# Patient Record
Sex: Female | Born: 1985
Health system: Southern US, Community
[De-identification: ages and names within clinical notes are randomized; demographics above are authoritative.]

## PROBLEM LIST (undated history)

## (undated) DIAGNOSIS — IMO0002 Reserved for concepts with insufficient information to code with codable children: Secondary | ICD-10-CM

## (undated) DIAGNOSIS — F410 Panic disorder [episodic paroxysmal anxiety] without agoraphobia: Secondary | ICD-10-CM

## (undated) DIAGNOSIS — R569 Unspecified convulsions: Secondary | ICD-10-CM

## (undated) DIAGNOSIS — R011 Cardiac murmur, unspecified: Secondary | ICD-10-CM

## (undated) HISTORY — DX: Cardiac murmur, unspecified: R01.1

## (undated) HISTORY — DX: Reserved for concepts with insufficient information to code with codable children: IMO0002

## (undated) HISTORY — DX: Panic disorder (episodic paroxysmal anxiety): F41.0

## (undated) HISTORY — DX: Unspecified convulsions: R56.9

## (undated) HISTORY — PX: TONSILLECTOMY: SUR1361

---

## 2008-09-24 ENCOUNTER — Ambulatory Visit: Payer: Self-pay | Admitting: Internal Medicine

## 2008-09-24 ENCOUNTER — Inpatient Hospital Stay (HOSPITAL_COMMUNITY): Admission: EM | Admit: 2008-09-24 | Discharge: 2008-09-25 | Payer: Self-pay | Admitting: Emergency Medicine

## 2008-10-02 ENCOUNTER — Encounter (INDEPENDENT_AMBULATORY_CARE_PROVIDER_SITE_OTHER): Payer: Self-pay | Admitting: Internal Medicine

## 2008-10-27 ENCOUNTER — Encounter: Admission: RE | Admit: 2008-10-27 | Discharge: 2008-10-27 | Payer: Self-pay | Admitting: Orthopedic Surgery

## 2008-11-09 ENCOUNTER — Emergency Department (HOSPITAL_COMMUNITY): Admission: EM | Admit: 2008-11-09 | Discharge: 2008-11-09 | Payer: Self-pay | Admitting: Family Medicine

## 2009-01-09 ENCOUNTER — Emergency Department (HOSPITAL_COMMUNITY): Admission: EM | Admit: 2009-01-09 | Discharge: 2009-01-09 | Payer: Self-pay | Admitting: Emergency Medicine

## 2009-08-03 ENCOUNTER — Emergency Department (HOSPITAL_COMMUNITY): Admission: EM | Admit: 2009-08-03 | Discharge: 2009-08-03 | Payer: Self-pay | Admitting: Emergency Medicine

## 2009-08-05 ENCOUNTER — Emergency Department (HOSPITAL_COMMUNITY): Admission: EM | Admit: 2009-08-05 | Discharge: 2009-08-05 | Payer: Self-pay | Admitting: Emergency Medicine

## 2010-01-13 ENCOUNTER — Emergency Department (HOSPITAL_COMMUNITY): Admission: EM | Admit: 2010-01-13 | Discharge: 2010-01-13 | Payer: Self-pay | Admitting: Emergency Medicine

## 2011-01-14 LAB — DIFFERENTIAL
Basophils Absolute: 0 10*3/uL (ref 0.0–0.1)
Lymphocytes Relative: 7 % — ABNORMAL LOW (ref 12–46)
Lymphs Abs: 0.7 10*3/uL (ref 0.7–4.0)
Neutro Abs: 10 10*3/uL — ABNORMAL HIGH (ref 1.7–7.7)

## 2011-01-14 LAB — POCT I-STAT, CHEM 8
BUN: 12 mg/dL (ref 6–23)
Calcium, Ion: 0.94 mmol/L — ABNORMAL LOW (ref 1.12–1.32)
Chloride: 109 mEq/L (ref 96–112)
Creatinine, Ser: 0.5 mg/dL (ref 0.4–1.2)
Glucose, Bld: 84 mg/dL (ref 70–99)
HCT: 46 % (ref 36.0–46.0)
Hemoglobin: 15.6 g/dL — ABNORMAL HIGH (ref 12.0–15.0)
Potassium: 3.9 mEq/L (ref 3.5–5.1)
Sodium: 138 mEq/L (ref 135–145)
TCO2: 21 mmol/L (ref 0–100)

## 2011-01-14 LAB — POCT URINALYSIS DIP (DEVICE)
Bilirubin Urine: NEGATIVE
Ketones, ur: 15 mg/dL — AB
Specific Gravity, Urine: 1.025 (ref 1.005–1.030)

## 2011-01-14 LAB — CBC
Hemoglobin: 14.9 g/dL (ref 12.0–15.0)
Platelets: 323 10*3/uL (ref 150–400)
RDW: 13 % (ref 11.5–15.5)
WBC: 11 10*3/uL — ABNORMAL HIGH (ref 4.0–10.5)

## 2011-01-14 LAB — POCT PREGNANCY, URINE: Preg Test, Ur: NEGATIVE

## 2011-01-28 IMAGING — CT CT T SPINE W/O CM
3 of 4 series · 15 of 27 positions shown, 17 images · non-contrast
Comparison: Radiography 09/24/2008

CLINICAL DATA: Recent seizures.  Back pain.  Assess for fracture.

CT THORACIC SPINE WITHOUT CONTRAST
TECHNIQUE: Multidetector CT imaging of the thoracic spine was
performed without intravenous contrast administration. Multiplanar
CT image reconstructions were also generated

[Series 3: t spine · axial · 0.33mm/px · z∈[-208,-28]mm · 5 of 110 slices shown, 7 images]
[im 19/110  soft-tissue]
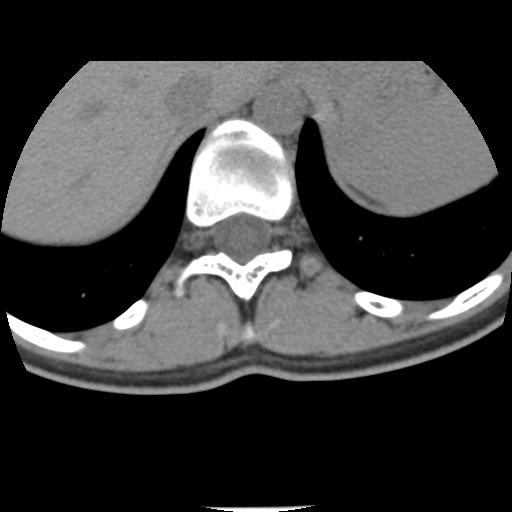
[im 19/110  bone]
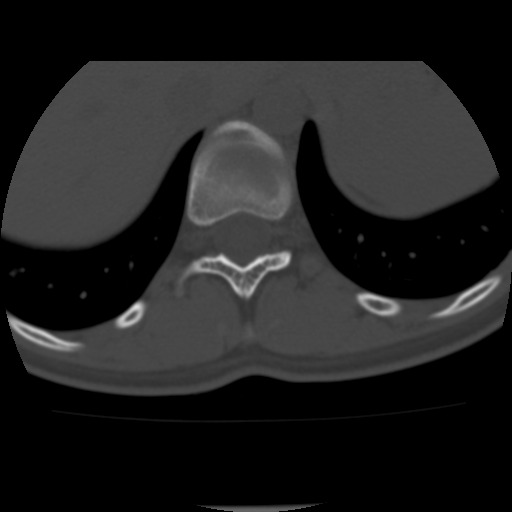
[im 37/110  bone]
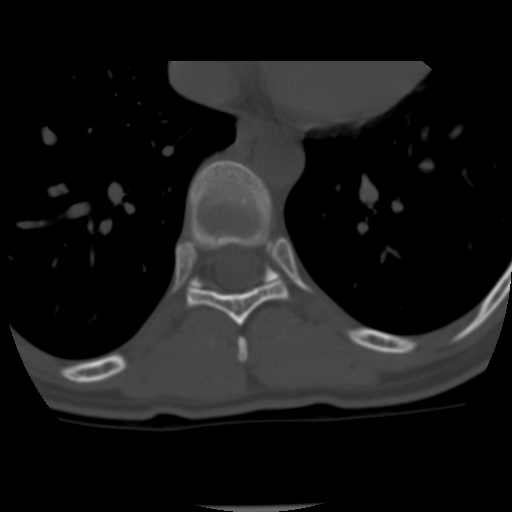
[im 55/110  bone]
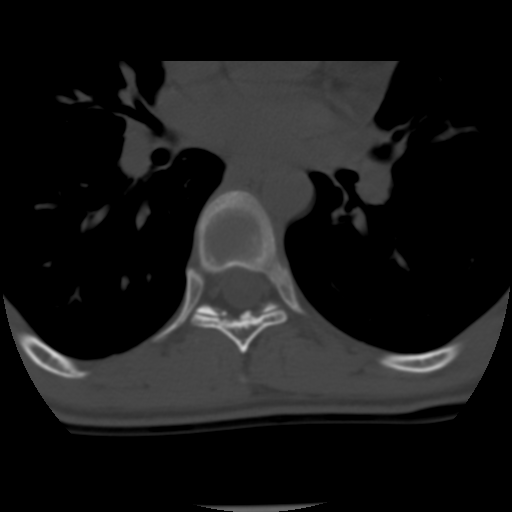
[im 73/110  bone]
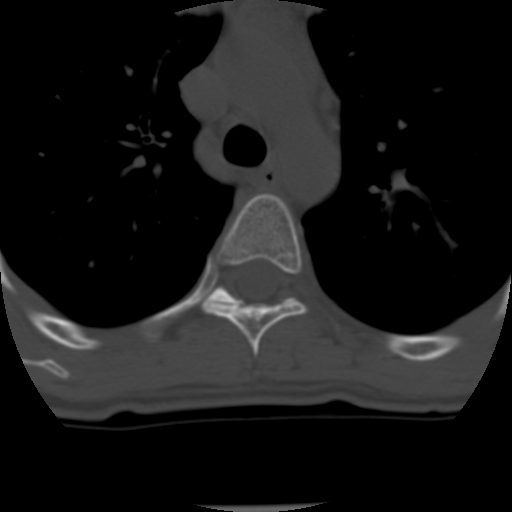
[im 91/110  soft-tissue]
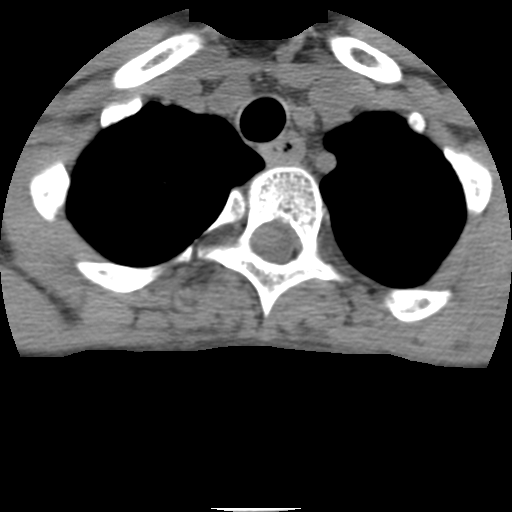
[im 91/110  bone]
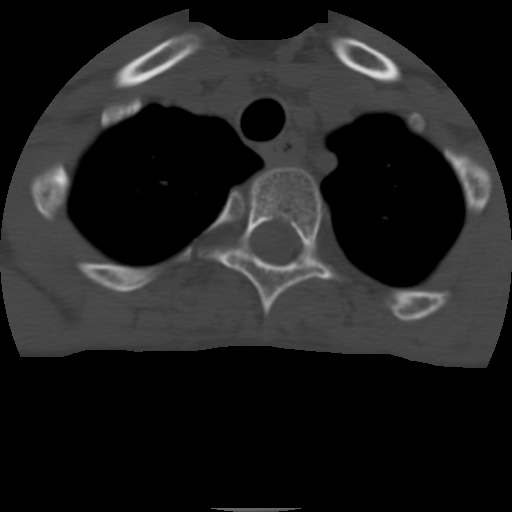

[Series 4: bone windows · axial · 0.33mm/px · z∈[-208,-28]mm · 5 of 110 slices shown]
[im 19/110  bone]
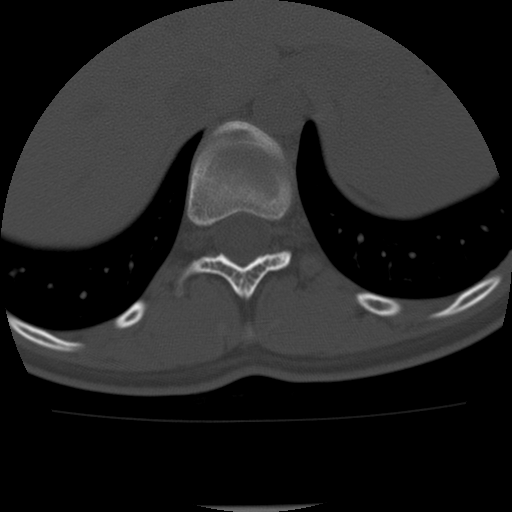
[im 37/110  bone]
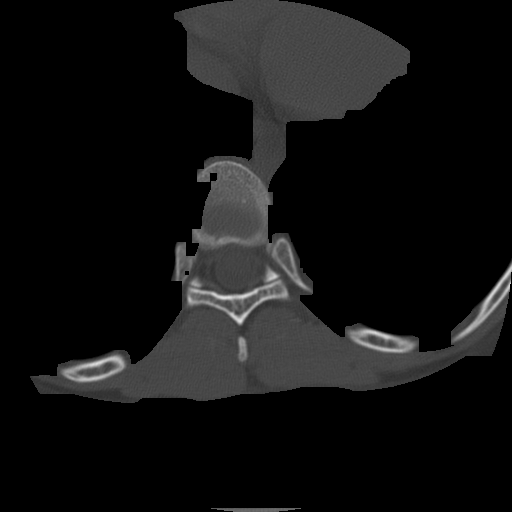
[im 55/110  bone]
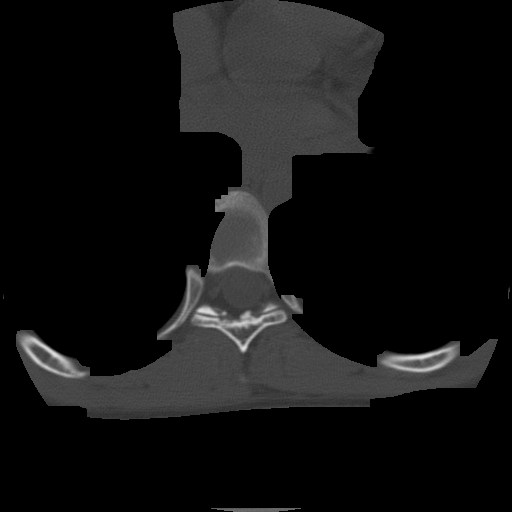
[im 73/110  bone]
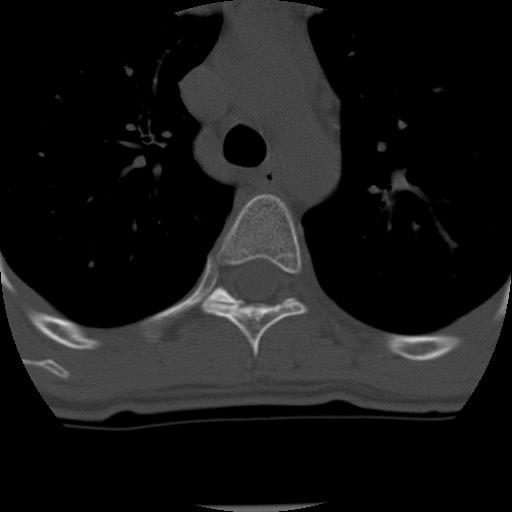
[im 91/110  bone]
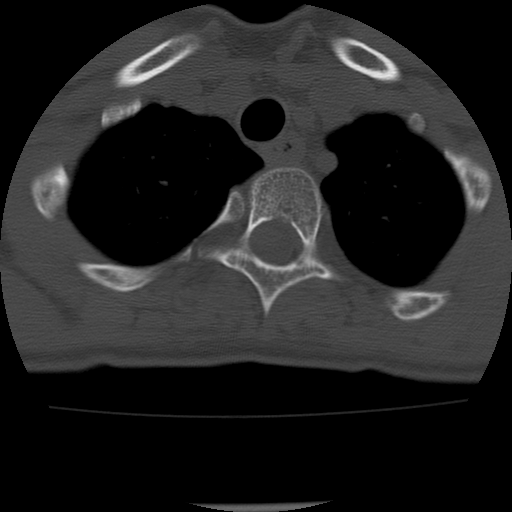

[Series 500: coronal · coronal · 0.54mm/px · 5 of 40 slices shown]
[im 7/40  bone]
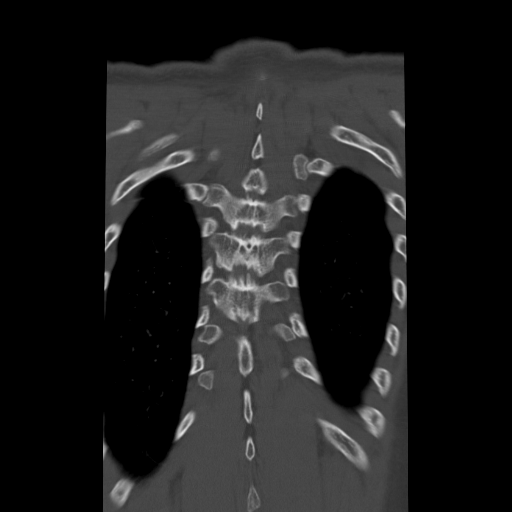
[im 14/40  bone]
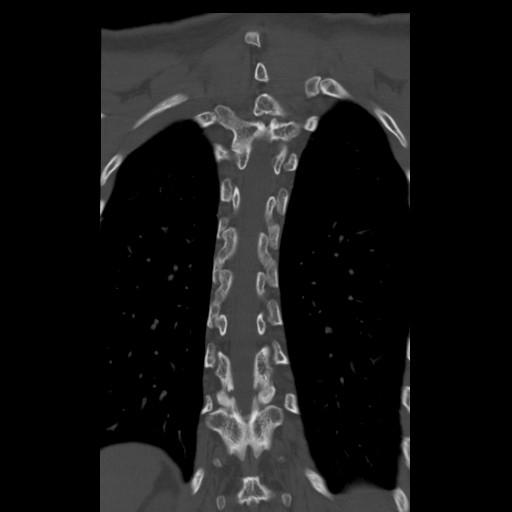
[im 20/40  bone]
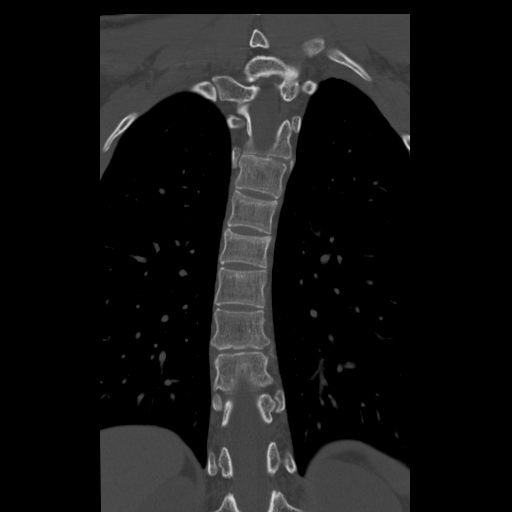
[im 27/40  bone]
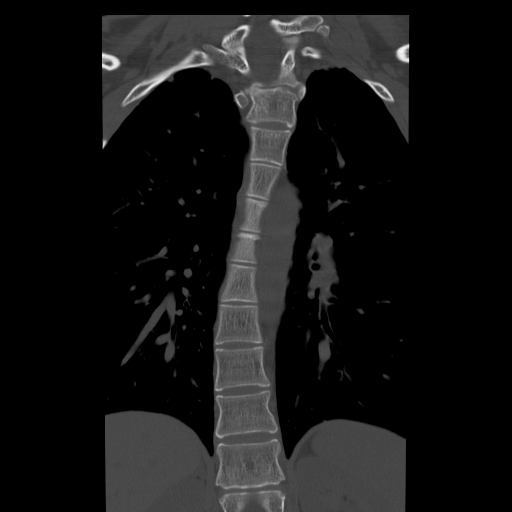
[im 33/40  bone]
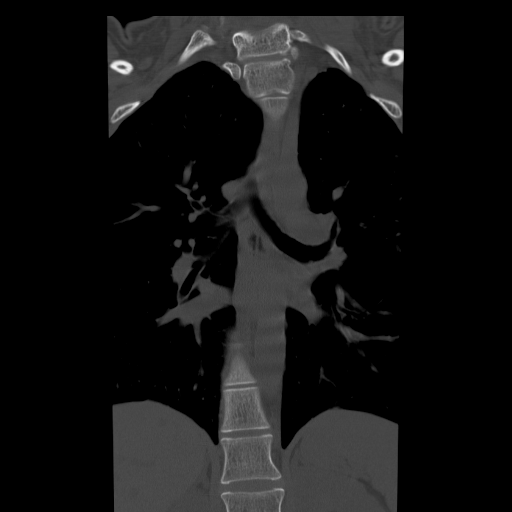

[15 of 27 positions shown; findings below may reference images not displayed]

FINDINGS: There is upper thoracic scoliosis convex to the left, and
mid thoracic scoliosis convex to the right.  There are minor
superior end plate fractures at T6 and T7 and to a minimal extent
at T8.  Loss of height is less than 10% at T7 and T8 and even less
than that at T9. No retropulsion.  No encroachment upon the canal
or foramina.
IMPRESSION: Scoliosis.

Minor superior end plate fractures at T6, T7 and T8.  Loss of
height of no more than 10% at T6 and T7 and even less than that at
T8.

## 2011-02-11 NOTE — H&P (Signed)
NAMEHUNTLEY, DEMEDEIROS NO.:  [id number]   MEDICAL RECORD NO.:  [id number]          PATIENT TYPE:  INP   LOCATION:  3005                         FACILITY:  [facility]   PHYSICIAN:  Gardiner Barefoot, MD    DATE OF BIRTH:  24-Feb-1986   DATE OF ADMISSION:  09/23/2008  DATE OF DISCHARGE:                              HISTORY & PHYSICAL   PRIMARY CARE PHYSICIAN:  Carolynn Sayers, NP   CHIEF COMPLAINT:  Fall.   HISTORY OF PRESENT ILLNESS:  This is a 25 year old female who recently  diagnosed with depressions and started on Wellbutrin who had a fall at  work where she blooded her nose.  The patient does not remember the  event, came out of the bathroom where she had at work and was  disoriented and brought into the emergency room.  During evaluation in  the emergency room, it was noticed that she had a seizure-like activity  by arms straining and then started to shake with tonic-clonic-like  activity with eyes rolling back.  This lasted for several minutes and  then has about 1-2 minutes postictal state and continued confusion for  several minutes after that.  No recent illnesses, no fever, and no  prodrome associated with this.   PAST MEDICAL HISTORY:  Depression.   MEDICATIONS:  1. Wellbutrin with dose recently increased.  2. Next birth control pills.   DRUG ALLERGIES:  No known drug allergies.   FAMILY HISTORY:  No history of seizures in any of family member.   SOCIAL HISTORY:  Occasional alcohol.  No cigarettes.   REVIEW OF SYSTEMS:  Negative except as per the history of present  illness.   PHYSICAL EXAMINATION:  VITAL SIGNS:  Temperature is 97.2, pulses of 100,  respirations 16, blood pressure 114/81, and O2 sats 97%.  GENERAL:  The patient is awake, alert, and oriented x3 in no acute  distress.  CARDIOVASCULAR:  Regular rate and rhythm.  No murmurs, rubs, or gallops.  LUNGS:  Clear to auscultation bilaterally.  ABDOMEN:  Soft, nontender, and nondistended.   Positive bowel sounds.  No  hepatosplenomegaly.  EXTREMITIES:  No cyanosis, clubbing, or edema.  NEUROLOGIC:  Nonfocal, equal strength bilaterally.  Reflexes intact.   Laboratory data includes a CT of the head and face with no acute  pathology or fractures.  CMP with no significant abnormalities.  CBC  with elevated white count 14.9 with 87% neutrophils.  Pregnancy  negative.   ASSESSMENT AND PLAN:  1. New seizure.  The event is temporally related with her Wellbutrin,      which has been started only last several weeks with a dose      increased in the last several days.  No significantly history of      any seizures and the CT scan does not suggest any sort of mass      lesion or bleed.  It was likely that she had a fall secondary to      seizure at her work and had a subsequent seizure here in the      emergency room.  Will certainly hold  her Wellbutrin and observe her      overnight with seizure precautions.  Also will check a urine drug      screen to assure there is no other drug aboard.  2. Prophylaxis.  The patient does not require deep vein thrombosis      prophylaxis due to her age.      Gardiner Barefoot, MD  Electronically Signed     RWC/MEDQ  D:  09/24/2008  T:  09/24/2008  Job:  [id number]   cc:   Carolynn Sayers, N.P.

## 2011-02-11 NOTE — Consult Note (Signed)
[name], [name] NO.:  [id number]   MEDICAL RECORD NO.:  [id number]          PATIENT TYPE:  INP   LOCATION:  3005                         FACILITY:  [facility]   PHYSICIAN:  Noel Christmas, MD    DATE OF BIRTH:  03-Jun-1986   DATE OF CONSULTATION:  09/24/2008  DATE OF DISCHARGE:                                 CONSULTATION   REFERRING PHYSICIAN:  [facility].   REASON FOR CONSULTATION:  New-onset seizure activity.   This is a 25 year old lady, who suffered an episode of loss of  consciousness while at work yesterday.  The patient was alone in  restroom at that time.  She woke up on the floor and did not remember  how she got there.  Workers described her as confused and incoherent.  The patient was bleeding from her nose, but was not aware of bleeding  until it was pointed out to her.  She was brought to the emergency room  for further evaluation.  The patient has second witnessed generalized  seizure while in the emergency room.  Her physical examination was  unremarkable except for mild redness of her forehead and superficial  abrasion on her chin.  Nose bleed had ceased prior to arriving in the  emergency room.  CT of her head showed no acute intracranial  abnormality.  WBC count was 14.9.  CBC was otherwise unremarkable.  Serum electrolytes were normal as was glucose, BUN, and creatinine.  Liver enzymes were also normal.  Urine drug screen was not obtained.  The patient was started on Wellbutrin about 3 weeks ago.  About 5 days  prior to admission, Wellbutrin was increased from 300 mg per day to 450  mg per day.  The patient's only other medication is birth control pills.   PAST MEDICAL HISTORY:  Unremarkable except for recurrent tonsillitis.  The patient is scheduled for elective tonsillectomy.   FAMILY HISTORY:  Noncontributory.   EXAMINATION:  Natalie [name] was young slender female, who was alert and  cooperative, in no acute distress.  She was  well-oriented to time as  well as place.  Short-term and long-term memory were normal except for  events yesterday surrounding the time of her periods of unconsciousness  and postictal confusion afterwards.  Pupils were equal and reactive  normally to light.  Extraocular movements were full and conjugate.  Visual fields were intact and normal.  There was no facial weakness.  Hearing was normal.  Speech and palatal movement were normal.  Coordination of extremities was normal.  Strength and muscle tone were  normal throughout.  Deep tendon reflexes were normal and symmetrical.  Plantar responses were flexor.  Sensory examination was normal.  Carotid  auscultation was normal.   CLINICAL IMPRESSION:  New-onset generalized seizure activity of unclear  etiology, but may well be secondary to treatment with Wellbutrin.  New-  onset primary seizure disorder, however, cannot be ruled out at this  point.   RECOMMENDATIONS:  1. MRI of her brain without and with contrast is planned.  2. EEG in the a.m. is planned.  3. Defer anticonvulsant medication unless  the patient has a recurrent      seizure (if so would treat with Keppra 1 g      intravenously as a loading dose followed by 500 mg q.12 h. either      IV or p.o.).  4. Urine drug screen.   Thank you for asking me to evaluate Natalie [name].      Noel Christmas, MD  Electronically Signed     CS/MEDQ  D:  09/24/2008  T:  09/25/2008  Job:  [id number]

## 2011-02-11 NOTE — Procedures (Signed)
EEG:  [id number].   CLINICAL HISTORY:  The patient is a 25 year old who was admitted for a  fall and a seizure.  She was at work.  She lost consciousness and had  seizure-like activity and hit the front of her face, nose, forehead,  bruised and blacked her eyes.  She has a history of depression, she is  on Wellbutrin.  Study is being done to look for presence of epilepsy  (780.39).   PROCEDURE:  Tracing was carried out on a 32-channel digital Cadwell  recorder reformatted into 16 channel montages with one devoted to EKG.  The patient was awake and briefly drowsy during the recording.  The  International 10/20 System Lead Placement was used.   Medications include diazepam, Zofran, Tylenol. Ativan.   DESCRIPTION OF FINDINGS:  Dominant frequency is a 10 Hz, 40-50 microvolt  well-modulated and regulated activity that attenuates partially with eye  opening.   Background activity is predominately alpha and beta range activity.  From time to time, generalized delta and lower theta range activity was  superimposed upon this.  There was a brief burst of sharply contoured  slow wave activity at T4, this did not recur.   Activating procedures with hyperventilation cause significant movement  artifact.  Photic stimulation induced a driving response at 7 and 9 Hz.  There was no interictal epileptiform activity in the form of spikes or  sharp waves.   EKG showed regular sinus rhythm with ventricular response at 72 beats  per minute.   IMPRESSION:  Normal waking record.      Deanna Artis. Sharene Skeans, M.D.  Electronically Signed     NWG:NFAO  D:  09/25/2008 11:19:49  T:  09/26/2008 00:03:03  Job #:  [id number]   cc:   Dr. Janee Morn

## 2011-02-11 NOTE — Discharge Summary (Signed)
Natalie [name], Natalie [name]            ACCOUNT NO.:  [id number]   MEDICAL RECORD NO.:  [id number]          PATIENT TYPE:  INP   LOCATION:  3005                         FACILITY:  [facility]   PHYSICIAN:  Michiel Cowboy, MDDATE OF BIRTH:  1986/06/11   DATE OF ADMISSION:  09/24/2008  DATE OF DISCHARGE:  09/25/2008                               DISCHARGE SUMMARY   PRIMARY CARE PHYSICIAN:  Nurse Practitioner, Carolynn Sayers at [facility] at [place].   CONSULTATIONS:  Noel Christmas, MD, Neurology.   DISCHARGE DIAGNOSES:  Seizure, likely felt to be secondary to Wellbutrin  and fall and head trauma.   HISTORY OF PRESENT ILLNESS:  Please see full H&P for all details.  Briefly, this is a 25 year old female with history of depression,  started on Wellbutrin and had increased the dose recently.  She fell at  work, does not remember how it happened.  Was disoriented.  Felt most  likely had a seizure.  In the emergency department, she had seizure-like  activity, tonic clonic-like activity with eyes rolling back.  She had  undergone MRI and EEG which was unremarkable.  Seen by Neurology.  Since  old studies were unremarkable, it was felt this was most likely  secondary to Wellbutrin.   STUDIES:  1. MRI of the brain, no significant intracranial abnormality except      for osteoma, left frontal sinus.  2. CT scan of the head showing no acute intracranial abnormality and      CT scan of her face with no evidence of facial bone injury.  There      is chronic sinus inflammation otherwise.  3. Chest x-ray on December 27 showed no acute chest process.  4. EEG done today per Neurology is unremarkable   DISCHARGE MEDICATIONS:  1. Hold Wellbutrin.  The patient is to be seen as soon as possible      with her primary care Nefertari Rebman, because she will otherwise need to      be started on an antidepressant medication which is different.      Will defer to her primary care physician to what they feel most     comfortable using, but the patient probably should avoid      Wellbutrin.  2. Birth control medications, Tri-Sprintec daily.   FOLLOWUP:  The patient is to follow up with her primary care doctor.      Michiel Cowboy, MD  Electronically Signed     AVD/MEDQ  D:  09/25/2008  T:  09/25/2008  Job:  [id number]   cc:   Deboraha Sprang at [facility] Carolynn Sayers, NP

## 2011-03-10 ENCOUNTER — Emergency Department (HOSPITAL_COMMUNITY)
Admission: EM | Admit: 2011-03-10 | Discharge: 2011-03-11 | Disposition: A | Discharge status: Other Acute Inpatient Hospital | Payer: 59 | Attending: Emergency Medicine | Admitting: Emergency Medicine

## 2011-03-10 DIAGNOSIS — F1910 Other psychoactive substance abuse, uncomplicated: Secondary | ICD-10-CM | POA: Insufficient documentation

## 2011-03-10 DIAGNOSIS — F411 Generalized anxiety disorder: Secondary | ICD-10-CM | POA: Insufficient documentation

## 2011-03-10 DIAGNOSIS — Z139 Encounter for screening, unspecified: Secondary | ICD-10-CM | POA: Insufficient documentation

## 2011-03-10 LAB — DIFFERENTIAL
Basophils Relative: 0 % (ref 0–1)
Eosinophils Absolute: 0.1 10*3/uL (ref 0.0–0.7)
Eosinophils Relative: 1 % (ref 0–5)
Lymphocytes Relative: 20 % (ref 12–46)
Monocytes Relative: 8 % (ref 3–12)
Neutrophils Relative %: 71 % (ref 43–77)

## 2011-03-10 LAB — RAPID URINE DRUG SCREEN, HOSP PERFORMED
Amphetamines: NOT DETECTED
Benzodiazepines: NOT DETECTED

## 2011-03-10 LAB — CBC
Hemoglobin: 13.9 g/dL (ref 12.0–15.0)
MCV: 90.4 fL (ref 78.0–100.0)
Platelets: 283 10*3/uL (ref 150–400)
RBC: 4.36 MIL/uL (ref 3.87–5.11)
WBC: 6.4 10*3/uL (ref 4.0–10.5)

## 2011-03-10 LAB — ETHANOL: Alcohol, Ethyl (B): <11 mg/dL — ABNORMAL HIGH (ref 0–10)

## 2011-03-10 LAB — BASIC METABOLIC PANEL
CO2: 24 mEq/L (ref 19–32)
Calcium: 9.7 mg/dL (ref 8.4–10.5)
Creatinine, Ser: 0.6 mg/dL (ref 0.4–1.2)
Glucose, Bld: 93 mg/dL (ref 70–99)

## 2011-03-11 ENCOUNTER — Inpatient Hospital Stay (HOSPITAL_COMMUNITY)
Admission: AD | Admit: 2011-03-11 | Discharge: 2011-03-14 | DRG: 885 | Disposition: A | Discharge status: Home / Self Care | Payer: 59 | Source: Ambulatory Visit | Attending: Psychiatry | Admitting: Psychiatry

## 2011-03-11 DIAGNOSIS — F1010 Alcohol abuse, uncomplicated: Secondary | ICD-10-CM

## 2011-03-11 DIAGNOSIS — F329 Major depressive disorder, single episode, unspecified: Secondary | ICD-10-CM

## 2011-03-11 DIAGNOSIS — M549 Dorsalgia, unspecified: Secondary | ICD-10-CM

## 2011-03-11 DIAGNOSIS — F1210 Cannabis abuse, uncomplicated: Secondary | ICD-10-CM

## 2011-03-11 DIAGNOSIS — H6690 Otitis media, unspecified, unspecified ear: Secondary | ICD-10-CM

## 2011-03-11 DIAGNOSIS — F339 Major depressive disorder, recurrent, unspecified: Secondary | ICD-10-CM

## 2011-03-11 DIAGNOSIS — Z560 Unemployment, unspecified: Secondary | ICD-10-CM

## 2011-03-11 DIAGNOSIS — F909 Attention-deficit hyperactivity disorder, unspecified type: Secondary | ICD-10-CM

## 2011-03-11 DIAGNOSIS — F1910 Other psychoactive substance abuse, uncomplicated: Secondary | ICD-10-CM

## 2011-03-11 DIAGNOSIS — F609 Personality disorder, unspecified: Secondary | ICD-10-CM

## 2011-03-11 DIAGNOSIS — Z6379 Other stressful life events affecting family and household: Secondary | ICD-10-CM

## 2011-03-11 LAB — PREGNANCY, URINE: Preg Test, Ur: NEGATIVE

## 2011-03-11 LAB — HEPATIC FUNCTION PANEL
ALT: 16 U/L (ref 0–35)
Alkaline Phosphatase: 87 U/L (ref 39–117)
Bilirubin, Direct: 0.1 mg/dL (ref 0.0–0.3)
Indirect Bilirubin: 0.3 mg/dL (ref 0.3–0.9)
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
## 2011-03-19 NOTE — Discharge Summary (Signed)
  NAMECARESSE, SEDIVY            ACCOUNT NO.:  [id number]  MEDICAL RECORD NO.:  [id number]  LOCATION:  0304                          [facility]ILITY[facility]  PHYSICIAN:  Franchot Gallo, MD     DATE OF BIRTH:  [birth year]  DATE OF ADMISSION:  03/11/2011 DATE OF DISCHARGE:  03/14/2011                              DISCHARGE SUMMARY   REASON FOR ADMISSION:  This is a 25 year old female who was admitted for Ritalin and Klonopin abuse, taking none in the past 2 weeks.  She was reporting her sleep was good.  Her appetite was good and having mild depressive symptoms but no suicidal or homicidal thoughts and having no substance withdrawal noticed.  FINAL IMPRESSION:  Major depressive disorder, recurrent without psychotic features, polysubstance abuse, cannabis alcohol stimulants and benzodiazepines.  PERTINENT LABS:  Urine drug screen is positive for marijuana.  SIGNIFICANT FINDINGS:  The patient was admitted to the adult unit on the substance group. We will assess need for medications.  The patient was attending groups, actively participating.  She admits to problems with her Ritalin and alcohol.  She denied that she overused her Klonopin, but wanting to get off all substances.  She denied any suicidal or was having no withdrawal symptoms and was interested in the IOP program. There was information obtained from the mother who felt that the patient had a prescription drug problem and that she was over medicating herself.  The patient was reporting not feeling so well after starting on Zoloft to feeling very shaky and twitchy, was having difficulty eating and reporting body aches and had trouble sleeping.  We talked to the pharmacist who felt that those symptoms would resolve quickly, so we continued with the Zoloft, but felt that the patient could benefit from a mood stabilizer as she was endorsing mood swings.  We talked to the patient who was agreeable to continuing both her Zoloft and  adding the Lamictal. On day of discharge the patient was feeling ready to go home. Her sleep was good, appetite was improving, having mild depressive symptoms rating it a 3 on a scale of 1-10, adamantly denying any suicidal or homicidal thoughts or delusional thinking.  Her anxiety was good, having no substance withdrawal.  The patient was discharged home.  DISCHARGE MEDICATIONS: 1. Zoloft 50 mg daily. 2. Lamictal 25 mg daily.  Her follow-up is with [facility] program on 03/17/2011 at 11:00 a.m., phone number [phone].     Landry Corporal, N.P.   ______________________________ Franchot Gallo, MD    JO/MEDQ  D:  03/18/2011  T:  03/18/2011  Job:  [id number]  Electronically Signed by Limmie PatriciaP. on 03/19/2011 09:32:20 AM Electronically Signed by Franchot Gallo MD on 03/19/2011 04:54:52 PM

## 2011-04-19 ENCOUNTER — Emergency Department (HOSPITAL_COMMUNITY): Payer: 59

## 2011-04-19 ENCOUNTER — Emergency Department (HOSPITAL_COMMUNITY)
Admission: EM | Admit: 2011-04-19 | Discharge: 2011-04-19 | Disposition: A | Discharge status: Home / Self Care | Payer: 59 | Attending: Emergency Medicine | Admitting: Emergency Medicine

## 2011-04-19 ENCOUNTER — Inpatient Hospital Stay (INDEPENDENT_AMBULATORY_CARE_PROVIDER_SITE_OTHER)
Admission: RE | Admit: 2011-04-19 | Discharge: 2011-04-19 | Disposition: A | Discharge status: Home / Self Care | Payer: 59 | Source: Ambulatory Visit | Attending: Emergency Medicine | Admitting: Emergency Medicine

## 2011-04-19 DIAGNOSIS — K8020 Calculus of gallbladder without cholecystitis without obstruction: Secondary | ICD-10-CM

## 2011-04-19 DIAGNOSIS — R109 Unspecified abdominal pain: Secondary | ICD-10-CM | POA: Insufficient documentation

## 2011-04-19 DIAGNOSIS — R1013 Epigastric pain: Secondary | ICD-10-CM | POA: Insufficient documentation

## 2011-04-19 DIAGNOSIS — R110 Nausea: Secondary | ICD-10-CM | POA: Insufficient documentation

## 2011-04-19 DIAGNOSIS — R10816 Epigastric abdominal tenderness: Secondary | ICD-10-CM

## 2011-04-19 LAB — DIFFERENTIAL
Basophils Absolute: 0 10*3/uL (ref 0.0–0.1)
Basophils Relative: 0 % (ref 0–1)
Eosinophils Relative: 1 % (ref 0–5)
Monocytes Absolute: 0.6 10*3/uL (ref 0.1–1.0)

## 2011-04-19 LAB — LIPASE, BLOOD: Lipase: 16 U/L (ref 11–59)

## 2011-04-19 LAB — POCT URINALYSIS DIP (DEVICE)
Leukocytes, UA: NEGATIVE
Nitrite: NEGATIVE
Protein, ur: NEGATIVE mg/dL
Urobilinogen, UA: 0.2 mg/dL (ref 0.0–1.0)
pH: 7 (ref 5.0–8.0)

## 2011-04-19 LAB — COMPREHENSIVE METABOLIC PANEL
ALT: 12 U/L (ref 0–35)
AST: 21 U/L (ref 0–37)
Alkaline Phosphatase: 74 U/L (ref 39–117)
CO2: 26 mEq/L (ref 19–32)
Calcium: 10 mg/dL (ref 8.4–10.5)
Chloride: 100 mEq/L (ref 96–112)
GFR calc non Af Amer: >60 mL/min (ref >60)
Potassium: 3.8 mEq/L (ref 3.5–5.1)
Sodium: 136 mEq/L (ref 135–145)

## 2011-04-19 LAB — AMYLASE: Amylase: 44 U/L (ref 0–105)

## 2011-04-19 LAB — CBC
MCHC: 35.8 g/dL (ref 30.0–36.0)
RDW: 12.4 % (ref 11.5–15.5)

## 2011-04-19 LAB — BASIC METABOLIC PANEL
Calcium: 9.3 mg/dL (ref 8.4–10.5)
Creatinine, Ser: 0.56 mg/dL (ref 0.50–1.10)
GFR calc non Af Amer: >60 mL/min (ref >60)
Glucose, Bld: 89 mg/dL (ref 70–99)
Sodium: 136 mEq/L (ref 135–145)

## 2011-04-21 ENCOUNTER — Emergency Department (HOSPITAL_COMMUNITY): Payer: 59

## 2011-04-21 ENCOUNTER — Telehealth: Payer: Self-pay | Admitting: Gastroenterology

## 2011-04-21 ENCOUNTER — Emergency Department (HOSPITAL_COMMUNITY)
Admission: EM | Admit: 2011-04-21 | Discharge: 2011-04-21 | Disposition: A | Discharge status: Home / Self Care | Payer: 59 | Attending: Emergency Medicine | Admitting: Emergency Medicine

## 2011-04-21 DIAGNOSIS — R1011 Right upper quadrant pain: Secondary | ICD-10-CM | POA: Insufficient documentation

## 2011-04-21 DIAGNOSIS — K2970 Gastritis, unspecified, without bleeding: Secondary | ICD-10-CM | POA: Insufficient documentation

## 2011-04-21 DIAGNOSIS — R1013 Epigastric pain: Secondary | ICD-10-CM | POA: Insufficient documentation

## 2011-04-21 DIAGNOSIS — R112 Nausea with vomiting, unspecified: Secondary | ICD-10-CM | POA: Insufficient documentation

## 2011-04-21 LAB — CBC
HCT: 36.3 % (ref 36.0–46.0)
MCHC: 35.3 g/dL (ref 30.0–36.0)
MCV: 89 fL (ref 78.0–100.0)
RDW: 12 % (ref 11.5–15.5)

## 2011-04-21 LAB — DIFFERENTIAL
Eosinophils Relative: 1 % (ref 0–5)
Lymphocytes Relative: 26 % (ref 12–46)
Lymphs Abs: 1.2 10*3/uL (ref 0.7–4.0)
Monocytes Absolute: 0.5 10*3/uL (ref 0.1–1.0)

## 2011-04-21 NOTE — Telephone Encounter (Signed)
Pt was called and she informed me that she spoke with her PCP and he advised her to go back to the ER.  She will call us back if she needs to f/u with GI.

## 2011-04-22 LAB — POCT I-STAT, CHEM 8
Chloride: 101 mEq/L (ref 96–112)
Creatinine, Ser: 0.8 mg/dL (ref 0.50–1.10)
Glucose, Bld: 90 mg/dL (ref 70–99)
Hemoglobin: 13.3 g/dL (ref 12.0–15.0)
Potassium: 3.8 mEq/L (ref 3.5–5.1)

## 2011-04-23 ENCOUNTER — Other Ambulatory Visit: Payer: Self-pay | Admitting: Gastroenterology

## 2011-04-25 ENCOUNTER — Ambulatory Visit
Admission: RE | Admit: 2011-04-25 | Discharge: 2011-04-25 | Disposition: A | Discharge status: Home / Self Care | Payer: 59 | Source: Ambulatory Visit | Attending: Gastroenterology | Admitting: Gastroenterology

## 2011-04-25 MED ORDER — IOHEXOL 300 MG/ML  SOLN
100.0000 mL | Freq: Once | INTRAMUSCULAR | Status: AC | PRN
  Administered 2011-04-25: 100 mL via INTRAVENOUS

## 2011-04-30 ENCOUNTER — Ambulatory Visit (HOSPITAL_COMMUNITY)
Admission: RE | Admit: 2011-04-30 | Discharge: 2011-04-30 | Disposition: A | Discharge status: Home / Self Care | Payer: 59 | Source: Ambulatory Visit | Attending: Gastroenterology | Admitting: Gastroenterology

## 2011-04-30 DIAGNOSIS — F3289 Other specified depressive episodes: Secondary | ICD-10-CM | POA: Insufficient documentation

## 2011-04-30 DIAGNOSIS — R109 Unspecified abdominal pain: Secondary | ICD-10-CM | POA: Insufficient documentation

## 2011-04-30 DIAGNOSIS — F329 Major depressive disorder, single episode, unspecified: Secondary | ICD-10-CM | POA: Insufficient documentation

## 2011-04-30 DIAGNOSIS — R112 Nausea with vomiting, unspecified: Secondary | ICD-10-CM | POA: Insufficient documentation

## 2011-07-04 LAB — COMPREHENSIVE METABOLIC PANEL
ALT: 12 U/L (ref 0–35)
Albumin: 4.4 g/dL (ref 3.5–5.2)
Alkaline Phosphatase: 53 U/L (ref 39–117)
BUN: 16 mg/dL (ref 6–23)
CO2: 25 mEq/L (ref 19–32)
Calcium: 9.1 mg/dL (ref 8.4–10.5)
Chloride: 104 mEq/L (ref 96–112)
Creatinine, Ser: 0.95 mg/dL (ref 0.4–1.2)
GFR calc non Af Amer: >60 mL/min (ref >60)
Glucose, Bld: 105 mg/dL — ABNORMAL HIGH (ref 70–99)
Sodium: 135 mEq/L (ref 135–145)
Total Bilirubin: 0.7 mg/dL (ref 0.3–1.2)
Total Bilirubin: 1 mg/dL (ref 0.3–1.2)
Total Protein: 7.6 g/dL (ref 6.0–8.3)

## 2011-07-04 LAB — URINE CULTURE
Colony Count: >=100000
Special Requests: NEGATIVE

## 2011-07-04 LAB — CBC
HCT: 35.1 % — ABNORMAL LOW (ref 36.0–46.0)
HCT: 40.4 % (ref 36.0–46.0)
Hemoglobin: 11.8 g/dL — ABNORMAL LOW (ref 12.0–15.0)
MCHC: 33.4 g/dL (ref 30.0–36.0)
MCHC: 34.2 g/dL (ref 30.0–36.0)
MCV: 89.6 fL (ref 78.0–100.0)
MCV: 89.8 fL (ref 78.0–100.0)
Platelets: 197 10*3/uL (ref 150–400)
Platelets: 275 10*3/uL (ref 150–400)
RDW: 13 % (ref 11.5–15.5)
RDW: 13.1 % (ref 11.5–15.5)
WBC: 4.4 10*3/uL (ref 4.0–10.5)

## 2011-07-04 LAB — URINALYSIS, ROUTINE W REFLEX MICROSCOPIC
Ketones, ur: NEGATIVE mg/dL
Protein, ur: NEGATIVE mg/dL
Urobilinogen, UA: 1 mg/dL (ref 0.0–1.0)

## 2011-07-04 LAB — DIFFERENTIAL
Basophils Absolute: 0 10*3/uL (ref 0.0–0.1)
Basophils Absolute: 0 10*3/uL (ref 0.0–0.1)
Basophils Relative: 0 % (ref 0–1)
Basophils Relative: 1 % (ref 0–1)
Eosinophils Absolute: 0 10*3/uL (ref 0.0–0.7)
Eosinophils Absolute: 0 10*3/uL (ref 0.0–0.7)
Lymphocytes Relative: 6 % — ABNORMAL LOW (ref 12–46)
Lymphs Abs: 1.5 10*3/uL (ref 0.7–4.0)
Monocytes Absolute: 1.1 10*3/uL — ABNORMAL HIGH (ref 0.1–1.0)
Monocytes Relative: 7 % (ref 3–12)
Neutro Abs: 12.9 10*3/uL — ABNORMAL HIGH (ref 1.7–7.7)
Neutro Abs: 2.4 10*3/uL (ref 1.7–7.7)
Neutrophils Relative %: 54 % (ref 43–77)
Neutrophils Relative %: 77 % (ref 43–77)

## 2011-07-04 LAB — RAPID URINE DRUG SCREEN, HOSP PERFORMED
Barbiturates: NOT DETECTED
Opiates: NOT DETECTED

## 2011-07-04 LAB — BASIC METABOLIC PANEL
BUN: 3 mg/dL — ABNORMAL LOW (ref 6–23)
CO2: 26 mEq/L (ref 19–32)
Calcium: 9 mg/dL (ref 8.4–10.5)
Creatinine, Ser: 0.62 mg/dL (ref 0.4–1.2)
GFR calc Af Amer: >60 mL/min (ref >60)

## 2011-09-16 ENCOUNTER — Emergency Department (HOSPITAL_COMMUNITY)
Admission: EM | Admit: 2011-09-16 | Discharge: 2011-09-17 | Disposition: A | Discharge status: Home / Self Care | Payer: 59 | Attending: Emergency Medicine | Admitting: Emergency Medicine

## 2011-09-16 ENCOUNTER — Encounter: Payer: Self-pay | Admitting: Emergency Medicine

## 2011-09-16 ENCOUNTER — Emergency Department (HOSPITAL_COMMUNITY): Payer: 59

## 2011-09-16 DIAGNOSIS — R05 Cough: Secondary | ICD-10-CM | POA: Insufficient documentation

## 2011-09-16 DIAGNOSIS — R059 Cough, unspecified: Secondary | ICD-10-CM | POA: Insufficient documentation

## 2011-09-16 DIAGNOSIS — J3489 Other specified disorders of nose and nasal sinuses: Secondary | ICD-10-CM | POA: Insufficient documentation

## 2011-09-16 DIAGNOSIS — IMO0001 Reserved for inherently not codable concepts without codable children: Secondary | ICD-10-CM | POA: Insufficient documentation

## 2011-09-16 DIAGNOSIS — J111 Influenza due to unidentified influenza virus with other respiratory manifestations: Secondary | ICD-10-CM | POA: Insufficient documentation

## 2011-09-16 DIAGNOSIS — R509 Fever, unspecified: Secondary | ICD-10-CM | POA: Insufficient documentation

## 2011-09-16 DIAGNOSIS — H9209 Otalgia, unspecified ear: Secondary | ICD-10-CM | POA: Insufficient documentation

## 2011-09-16 NOTE — ED Notes (Signed)
Pt here complaining of low grade fever and burning eyes and body aches and chills f

## 2011-09-16 NOTE — ED Notes (Signed)
PT. REPORTS PRODUCTIVE COUGH FOR 3 DAYS WITH RUNNY NOSE/ NASAL CONGESTION ,  RIGHT EAR ACHE .

## 2011-09-16 NOTE — ED Notes (Signed)
days.

## 2011-09-17 MED ORDER — GUAIFENESIN-CODEINE 100-10 MG/5ML PO SYRP: 5.0000 mL | ORAL_SOLUTION | Freq: Three times a day (TID) | ORAL | Status: AC | PRN

## 2011-09-17 MED ORDER — IBUPROFEN 200 MG PO TABS
400.0000 mg | ORAL_TABLET | Freq: Once | ORAL | Status: AC
  Administered 2011-09-17: 400 mg via ORAL
  Filled 2011-09-17: qty 2

## 2011-09-17 NOTE — ED Notes (Signed)
I gave the patient two warm blankets. 

## 2011-09-17 NOTE — ED Provider Notes (Signed)
History    25yF with cough. Feel achy all over. Rhinorrhea. Subjective fever. R ear pain. No drainage. No sore throat. Feels congested. No sick contacts. No sob. No unusal leg pain or swelling. Denies hx of blood clot. Does take exogenous estrogen. No n/v/d.   CSN: [id number] Arrival date & time: 09/16/2011  8:52 PM   First MD Initiated Contact with Patient 09/17/11 0002      Chief Complaint  Patient presents with  . Cough    (Consider location/radiation/quality/duration/timing/severity/associated sxs/prior treatment) HPI  History reviewed. No pertinent past medical history.  History reviewed. No pertinent past surgical history.  No family history on file.  History  Substance Use Topics  . Smoking status: Never Smoker   . Smokeless tobacco: Not on file  . Alcohol Use: No    OB History    Grav Para Term Preterm Abortions TAB SAB Ect Mult Living                  Review of Systems   Review of symptoms negative unless otherwise noted in HPI.   Allergies  Wellbutrin  Home Medications   Current Outpatient Rx  Name Route Sig Dispense Refill  . CLONAZEPAM 0.5 MG PO TABS Oral Take 0.5 mg by mouth 2 (two) times daily as needed. For anxiety     . NORGESTIMATE-ETH ESTRADIOL 0.25-35 MG-MCG PO TABS Oral Take 1 tablet by mouth daily.      . GUAIFENESIN-CODEINE 100-10 MG/5ML PO SYRP Oral Take 5 mLs by mouth 3 (three) times daily as needed for cough. 120 mL 0    BP 125/77  Pulse 101  Temp(Src) 100.7 F (38.2 C) (Oral)  Resp 18  SpO2 99%  LMP 09/02/2011  Physical Exam  Nursing note and vitals reviewed. Constitutional: She appears well-developed and well-nourished. No distress.  HENT:  Head: Normocephalic and atraumatic.  Right Ear: External ear normal.  Left Ear: External ear normal.  Mouth/Throat: Oropharynx is clear and moist. No oropharyngeal exudate.       TMs clear b/l. Boggy nasal mucosa  Eyes: Conjunctivae are normal. Pupils are equal, round, and reactive  to light. Right eye exhibits no discharge. Left eye exhibits no discharge.  Neck: Normal range of motion. Neck supple.  Cardiovascular: Normal rate, regular rhythm and normal heart sounds.  Exam reveals no gallop and no friction rub.   No murmur heard. Pulmonary/Chest: Effort normal and breath sounds normal. No respiratory distress.  Abdominal: Soft. She exhibits no distension. There is no tenderness.  Genitourinary:       No cva tenderness  Musculoskeletal: She exhibits no edema and no tenderness.  Lymphadenopathy:    She has no cervical adenopathy.  Neurological: She is alert.  Skin: Skin is warm and dry. No rash noted.  Psychiatric: She has a normal mood and affect. Her behavior is normal. Thought content normal.    ED Course  Procedures (including critical care time)  Labs Reviewed - No data to display Dg Chest 2 View  09/16/2011  *RADIOLOGY REPORT*  Clinical Data: Shortness of breath, cough  CHEST - 2 VIEW  Comparison: 09/24/2008  Findings: Lungs are clear. No pleural effusion or pneumothorax.  Cardiomediastinal silhouette is within normal limits.  Mild degenerative changes with curvature of the visualized thoracolumbar spine.  IMPRESSION: No evidence of acute cardiopulmonary disease.  Original Report Authenticated By: Charline Bills, M.D.     1. Fever   2. Influenza       MDM  25yf with fever,  general malaise and cough. Consider pneumonia, influenza, PE. Clinically suspect flu. Pt with no respiratory distress. Satting 100% on RA. Plan symptomatic tx and outpt fu. Strict return precautions discussed.       Raeford Razor, MD 09/24/11 1248

## 2011-10-16 ENCOUNTER — Emergency Department (HOSPITAL_COMMUNITY)
Admission: EM | Admit: 2011-10-16 | Discharge: 2011-10-16 | Disposition: A | Discharge status: Home / Self Care | Payer: 59 | Attending: Emergency Medicine | Admitting: Emergency Medicine

## 2011-10-16 DIAGNOSIS — K047 Periapical abscess without sinus: Secondary | ICD-10-CM | POA: Insufficient documentation

## 2011-10-16 DIAGNOSIS — K029 Dental caries, unspecified: Secondary | ICD-10-CM | POA: Insufficient documentation

## 2011-10-16 DIAGNOSIS — K0889 Other specified disorders of teeth and supporting structures: Secondary | ICD-10-CM

## 2011-10-16 MED ORDER — IBUPROFEN 200 MG PO TABS: 600.0000 mg | ORAL_TABLET | Freq: Once | ORAL | Status: DC

## 2011-10-16 MED ORDER — AMOXICILLIN 500 MG PO CAPS: 500.0000 mg | ORAL_CAPSULE | Freq: Three times a day (TID) | ORAL | Status: AC

## 2011-10-16 MED ORDER — HYDROCODONE-ACETAMINOPHEN 5-500 MG PO TABS: 1.0000 | ORAL_TABLET | Freq: Four times a day (QID) | ORAL | Status: AC | PRN

## 2011-10-16 NOTE — ED Notes (Signed)
Pt c/o of left sided tooth pain that is constant 9/10. Pt unable to sleep.

## 2011-10-16 NOTE — ED Provider Notes (Signed)
History     CSN: [id number]  Arrival date & time 10/16/11  1256   First MD Initiated Contact with Patient 10/16/11 1306     chief complaint:dental pain x 2 week. No chief complaint on file.   (Consider location/radiation/quality/duration/timing/severity/associated sxs/prior treatment) The history is provided by the patient.  pt c/o left upper dental pain for past 2 weeks, states thinks part of tooth broke off. Denies trauma/injury. Constant, dull pain worse w eating. No radiation of the pain. No facial redness/swelling. No fever or chills. No trouble breathing or swallowing.   No past medical history on file.  No past surgical history on file.  No family history on file.  History  Substance Use Topics  . Smoking status: Never Smoker   . Smokeless tobacco: Not on file  . Alcohol Use: No    OB History    Grav Para Term Preterm Abortions TAB SAB Ect Mult Living                  Review of Systems  Constitutional: Negative for fever and chills.  HENT: Negative for sore throat.   Respiratory: Negative for shortness of breath.   Skin: Negative for rash.    Allergies  Wellbutrin  Home Medications   Current Outpatient Rx  Name Route Sig Dispense Refill  . CLONAZEPAM 0.5 MG PO TABS Oral Take 0.5 mg by mouth 2 (two) times daily as needed. For anxiety     . NORGESTIMATE-ETH ESTRADIOL 0.25-35 MG-MCG PO TABS Oral Take 1 tablet by mouth daily.        BP 118/73  Pulse 69  Temp 98.2 F (36.8 C)  Resp 20  SpO2 100%  Physical Exam  Nursing note and vitals reviewed. Constitutional: She appears well-developed and well-nourished. No distress.  HENT:  Mouth/Throat: Oropharynx is clear and moist.       Left upper molar, decayed, broken posteriorly, assoc gum swelling/tenderness. No trismus. No facial swelling. Pharynx normal, no swelling.   Eyes: Conjunctivae are normal. No scleral icterus.  Neck: Neck supple. No tracheal deviation present.  Cardiovascular: Normal rate.     Pulmonary/Chest: Effort normal. No respiratory distress.  Abdominal: Normal appearance.  Neurological: She is alert.  Skin: Skin is warm and dry. No rash noted.  Psychiatric: She has a normal mood and affect.    ED Course  Procedures (including critical care time)     MDM  Confirmed only med allergy/intol is wellbutrin.  Discussed need for close dental follow up.   rx amox, vicodin.         Suzi Roots, MD 10/16/11 [phone]

## 2012-02-25 ENCOUNTER — Encounter: Payer: Self-pay | Admitting: *Deleted

## 2012-05-23 ENCOUNTER — Emergency Department (HOSPITAL_COMMUNITY): Payer: 59

## 2012-05-23 ENCOUNTER — Encounter (HOSPITAL_COMMUNITY): Payer: Self-pay | Admitting: Radiology

## 2012-05-23 ENCOUNTER — Emergency Department (HOSPITAL_COMMUNITY)
Admission: EM | Admit: 2012-05-23 | Discharge: 2012-05-24 | Disposition: A | Discharge status: Home / Self Care | Payer: 59 | Attending: Emergency Medicine | Admitting: Emergency Medicine

## 2012-05-23 DIAGNOSIS — R209 Unspecified disturbances of skin sensation: Secondary | ICD-10-CM | POA: Insufficient documentation

## 2012-05-23 DIAGNOSIS — M5030 Other cervical disc degeneration, unspecified cervical region: Secondary | ICD-10-CM | POA: Insufficient documentation

## 2012-05-23 DIAGNOSIS — R109 Unspecified abdominal pain: Secondary | ICD-10-CM | POA: Insufficient documentation

## 2012-05-23 DIAGNOSIS — IMO0002 Reserved for concepts with insufficient information to code with codable children: Secondary | ICD-10-CM | POA: Insufficient documentation

## 2012-05-23 DIAGNOSIS — R5381 Other malaise: Secondary | ICD-10-CM | POA: Insufficient documentation

## 2012-05-23 DIAGNOSIS — M542 Cervicalgia: Secondary | ICD-10-CM | POA: Insufficient documentation

## 2012-05-23 LAB — COMPREHENSIVE METABOLIC PANEL
ALT: 8 U/L (ref 0–35)
Alkaline Phosphatase: 69 U/L (ref 39–117)
CO2: 24 mEq/L (ref 19–32)
Chloride: 106 mEq/L (ref 96–112)
GFR calc Af Amer: >90 mL/min (ref >90)
GFR calc non Af Amer: >90 mL/min (ref >90)
Glucose, Bld: 88 mg/dL (ref 70–99)
Potassium: 3.7 mEq/L (ref 3.5–5.1)
Sodium: 139 mEq/L (ref 135–145)
Total Bilirubin: 0.2 mg/dL — ABNORMAL LOW (ref 0.3–1.2)
Total Protein: 7.1 g/dL (ref 6.0–8.3)

## 2012-05-23 LAB — CBC WITH DIFFERENTIAL/PLATELET
Hemoglobin: 10.9 g/dL — ABNORMAL LOW (ref 12.0–15.0)
Lymphocytes Relative: 12 % (ref 12–46)
Lymphs Abs: 1.2 10*3/uL (ref 0.7–4.0)
Neutrophils Relative %: 82 % — ABNORMAL HIGH (ref 43–77)
Platelets: 211 10*3/uL (ref 150–400)
RBC: 3.52 MIL/uL — ABNORMAL LOW (ref 3.87–5.11)
WBC: 10.1 10*3/uL (ref 4.0–10.5)

## 2012-05-23 LAB — ABO/RH: ABO/RH(D): O NEG

## 2012-05-23 LAB — URINALYSIS, ROUTINE W REFLEX MICROSCOPIC
Glucose, UA: NEGATIVE mg/dL
Hgb urine dipstick: NEGATIVE
Protein, ur: NEGATIVE mg/dL

## 2012-05-23 MED ORDER — SODIUM CHLORIDE 0.9 % IV SOLN
INTRAVENOUS | Status: DC
  Administered 2012-05-23: 19:00:00 via INTRAVENOUS
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
## 2012-05-23 NOTE — ED Notes (Signed)
Pt removed from LSB by Rancour, MD, pt remains in C collar

## 2012-05-23 NOTE — ED Notes (Signed)
Family member escorted back to patient's room. 

## 2012-05-23 NOTE — ED Notes (Signed)
PT. PLACED ON A CONTINOUS FETAL MONITOR / TOCOMETER , RAPID RESPONSE OB NURSE NOTIFIED.
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
## 2012-05-24 ENCOUNTER — Emergency Department (HOSPITAL_COMMUNITY): Payer: 59

## 2012-05-24 MED ORDER — OXYCODONE-ACETAMINOPHEN 5-325 MG PO TABS
1.0000 | ORAL_TABLET | Freq: Once | ORAL | Status: AC
  Administered 2012-05-24: 1 via ORAL
  Filled 2012-05-24: qty 1

## 2012-05-24 MED ORDER — RHO D IMMUNE GLOBULIN 1500 UNIT/2ML IJ SOLN
300.0000 ug | Freq: Once | INTRAMUSCULAR | Status: AC
  Administered 2012-05-24: 300 ug via INTRAMUSCULAR

## 2012-05-24 MED ORDER — HYDROCODONE-ACETAMINOPHEN 5-500 MG PO TABS: 1.0000 | ORAL_TABLET | Freq: Four times a day (QID) | ORAL | Status: AC | PRN

## 2012-05-24 NOTE — ED Notes (Signed)
Pt c/o left arm and leg tingling.  Pt states she is unable to point her left foot. Pt stated, "It feels like I'm moving it but it's not moving. And my arm feels the same.  I couldn't even feel them put the IV in".  Pt also c/o neck pain only with palpation - Rates pain 4/10

## 2012-05-24 NOTE — ED Notes (Signed)
Patient remains in MRI at this time.

## 2012-05-24 NOTE — ED Notes (Signed)
Patient transported to MRI 

## 2012-05-24 NOTE — ED Notes (Signed)
Pt returned from MRI °
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
## 2012-05-25 LAB — RH IG WORKUP (INCLUDES ABO/RH): Antibody Screen: NEGATIVE

## 2013-07-27 ENCOUNTER — Emergency Department (HOSPITAL_COMMUNITY)
Admission: EM | Admit: 2013-07-27 | Discharge: 2013-07-27 | Disposition: A | Discharge status: Home / Self Care | Payer: 59 | Attending: Emergency Medicine | Admitting: Emergency Medicine

## 2013-07-27 ENCOUNTER — Encounter (HOSPITAL_COMMUNITY): Payer: Self-pay | Admitting: Emergency Medicine

## 2013-07-27 DIAGNOSIS — R062 Wheezing: Secondary | ICD-10-CM | POA: Insufficient documentation

## 2013-07-27 DIAGNOSIS — R011 Cardiac murmur, unspecified: Secondary | ICD-10-CM | POA: Insufficient documentation

## 2013-07-27 DIAGNOSIS — F17200 Nicotine dependence, unspecified, uncomplicated: Secondary | ICD-10-CM | POA: Insufficient documentation

## 2013-07-27 DIAGNOSIS — Z87828 Personal history of other (healed) physical injury and trauma: Secondary | ICD-10-CM | POA: Insufficient documentation

## 2013-07-27 DIAGNOSIS — F410 Panic disorder [episodic paroxysmal anxiety] without agoraphobia: Secondary | ICD-10-CM | POA: Insufficient documentation

## 2013-07-27 DIAGNOSIS — J029 Acute pharyngitis, unspecified: Secondary | ICD-10-CM | POA: Insufficient documentation

## 2013-07-27 DIAGNOSIS — J3489 Other specified disorders of nose and nasal sinuses: Secondary | ICD-10-CM | POA: Insufficient documentation

## 2013-07-27 DIAGNOSIS — R05 Cough: Secondary | ICD-10-CM | POA: Insufficient documentation

## 2013-07-27 DIAGNOSIS — Z79899 Other long term (current) drug therapy: Secondary | ICD-10-CM | POA: Insufficient documentation

## 2013-07-27 DIAGNOSIS — R059 Cough, unspecified: Secondary | ICD-10-CM | POA: Insufficient documentation

## 2013-07-27 MED ORDER — HYDROCODONE-ACETAMINOPHEN 7.5-325 MG/15ML PO SOLN: 15.0000 mL | Freq: Four times a day (QID) | ORAL | Status: DC | PRN

## 2013-07-27 NOTE — ED Notes (Signed)
Present with 5 days of sore throat associated with loss of voice and pain with swallowing. Throat red.

## 2013-07-27 NOTE — ED Provider Notes (Signed)
CSN: [id number]     Arrival date & time 07/27/13  0010 History   First MD Initiated Contact with Patient 07/27/13 0118     Chief Complaint  Patient presents with  . Sore Throat   (Consider location/radiation/quality/duration/timing/severity/associated sxs/prior Treatment) HPI. History provided by pt.   Pt presents w/ severe sore throat for the past 5 days.  Associated w/ mild rhinorrhea and coughing.  Denies fever.   No relief w/ tylenol and ibuprofen.  Daughter has a cold currently.  H/o tonsillectomy. Past Medical History  Diagnosis Date  . Panic attacks   . Heart murmur   . Seizures   . Broken back     during seizure   Past Surgical History  Procedure Laterality Date  . Tonsillectomy     No family history on file. History  Substance Use Topics  . Smoking status: Current Some Day Smoker  . Smokeless tobacco: Not on file  . Alcohol Use: Yes     Comment: occasionally   OB History   Grav Para Term Preterm Abortions TAB SAB Ect Mult Living   1              Review of Systems  All other systems reviewed and are negative.    Allergies  Wellbutrin  Home Medications   Current Outpatient Rx  Name  Route  Sig  Dispense  Refill  . clonazePAM (KLONOPIN) 0.5 MG tablet   Oral   Take 0.5 mg by mouth 2 (two) times daily as needed for anxiety.         Marland Kitchen levonorgestrel-ethinyl estradiol (ENPRESSE,TRIVORA) tablet   Oral   Take 1 tablet by mouth daily.         . methylphenidate (RITALIN) 20 MG tablet   Oral   Take 20 mg by mouth 3 (three) times daily.          BP 120/90  Pulse 79  Temp(Src) 97.9 F (36.6 C) (Oral)  Resp 16  SpO2 100%  LMP 07/17/2013  Breastfeeding? Unknown Physical Exam  Nursing note and vitals reviewed. Constitutional: She is oriented to person, place, and time. She appears well-developed and well-nourished. No distress.  HENT:  Head: Normocephalic and atraumatic.  Erythema soft palate and posterior pharynx.  Tonsils absent. No exudate.   Uvula mid-line and no trismus.  Eyes:  Normal appearance  Neck: Normal range of motion.  Cardiovascular: Normal rate and regular rhythm.   Pulmonary/Chest: Effort normal. No respiratory distress.  Diffuse expiratory wheezing  Musculoskeletal: Normal range of motion.  Lymphadenopathy:    She has cervical adenopathy.  Neurological: She is alert and oriented to person, place, and time.  Skin: Skin is warm and dry. No rash noted.  Psychiatric: She has a normal mood and affect. Her behavior is normal.    ED Course  Procedures (including critical care time) Labs Review Labs Reviewed  RAPID STREP SCREEN   Imaging Review No results found.  EKG Interpretation   None       MDM  No diagnosis found. 27yo F, s/p tonsillectomy, presents w/ 5 days of sore throat.  Strep screen neg.  Pt d/c'd home w/ short course lortab elixir.  Recommended that she continue NSAID as well.  Return precautions discussed.  1:56 AM    Otilio Miu, PA-C 07/27/13 [phone]

## 2013-07-27 NOTE — ED Provider Notes (Signed)
Medical screening examination/treatment/procedure(s) were performed by non-physician practitioner and as supervising physician I was immediately available for consultation/collaboration.  Ayoub Arey M Kyan Yurkovich, MD 07/27/13 0746 

## 2013-07-28 ENCOUNTER — Emergency Department (HOSPITAL_COMMUNITY): Payer: Medicaid Other

## 2013-07-28 ENCOUNTER — Encounter (HOSPITAL_COMMUNITY): Payer: Self-pay | Admitting: Emergency Medicine

## 2013-07-28 ENCOUNTER — Emergency Department (HOSPITAL_COMMUNITY)
Admission: EM | Admit: 2013-07-28 | Discharge: 2013-07-28 | Disposition: A | Discharge status: Home / Self Care | Payer: Medicaid Other | Attending: Emergency Medicine | Admitting: Emergency Medicine

## 2013-07-28 DIAGNOSIS — Z8781 Personal history of (healed) traumatic fracture: Secondary | ICD-10-CM | POA: Insufficient documentation

## 2013-07-28 DIAGNOSIS — F410 Panic disorder [episodic paroxysmal anxiety] without agoraphobia: Secondary | ICD-10-CM | POA: Insufficient documentation

## 2013-07-28 DIAGNOSIS — F17200 Nicotine dependence, unspecified, uncomplicated: Secondary | ICD-10-CM | POA: Insufficient documentation

## 2013-07-28 DIAGNOSIS — R011 Cardiac murmur, unspecified: Secondary | ICD-10-CM | POA: Insufficient documentation

## 2013-07-28 DIAGNOSIS — G40909 Epilepsy, unspecified, not intractable, without status epilepticus: Secondary | ICD-10-CM | POA: Insufficient documentation

## 2013-07-28 DIAGNOSIS — J029 Acute pharyngitis, unspecified: Secondary | ICD-10-CM

## 2013-07-28 DIAGNOSIS — Z79899 Other long term (current) drug therapy: Secondary | ICD-10-CM | POA: Insufficient documentation

## 2013-07-28 LAB — CULTURE, GROUP A STREP

## 2013-07-28 MED ORDER — DEXAMETHASONE SODIUM PHOSPHATE 10 MG/ML IJ SOLN
10.0000 mg | Freq: Once | INTRAMUSCULAR | Status: AC
  Administered 2013-07-28: 10 mg via INTRAMUSCULAR
  Filled 2013-07-28: qty 1

## 2013-07-28 MED ORDER — IOHEXOL 300 MG/ML  SOLN
75.0000 mL | Freq: Once | INTRAMUSCULAR | Status: AC | PRN
  Administered 2013-07-28: 75 mL via INTRAVENOUS

## 2013-07-28 MED ORDER — OXYCODONE HCL 5 MG/5ML PO SOLN: 5.0000 mg | ORAL | Status: DC | PRN

## 2013-07-28 MED ORDER — HYDROCODONE-ACETAMINOPHEN 5-325 MG PO TABS
2.0000 | ORAL_TABLET | Freq: Once | ORAL | Status: AC
  Administered 2013-07-28: 2 via ORAL
  Filled 2013-07-28: qty 2

## 2013-07-28 NOTE — ED Provider Notes (Signed)
CSN: [id number]     Arrival date & time 07/28/13  1804 History  This chart was scribed for non-physician practitioner Dierdre Forth, PA, working with Flint Melter, MD by Ronal Fear, ED scribe. This patient was seen in room TR10C/TR10C and the patient's care was started at 8:12 PM.    Chief Complaint  Patient presents with  . Sore Throat    Patient is a 27 y.o. female presenting with pharyngitis. The history is provided by the patient. No language interpreter was used.  Sore Throat This is a new problem. The current episode started more than 1 week ago. The problem occurs rarely. The problem has been gradually worsening. Pertinent negatives include no chest pain, no abdominal pain, no headaches and no shortness of breath. The symptoms are aggravated by swallowing. Nothing relieves the symptoms.    HPI Comments: Natalie [name] is a 27 y.o. female who presents to the Emergency Department complaining of gradual onset gradually worsening sore throat for the past 7x days. But, her pain has grown worse over the past 2x days days with associated painful swallowing and talking. She was told that the sore throat was viral and given pain medication with some relief after being seen here on the 29th for the sore throat. She was given pain medication with slight relief but she has run out. Since then the pain has not resolved.  She states that she can't get out of bed, she says she feels like she has had a fever, but has not measured it.  She has no difficulty swallowing, nor does she feel like there is anything stuck. She feels like there is nothing in the back of her throat, she states that it feels swollen and tight. She denies runny nose or congested sinuses. Pt had her tonsils removed 7x years ago.  Pt does not appear to be in any acute distress with no other complaints.   Past Medical History  Diagnosis Date  . Panic attacks   . Heart murmur   . Seizures   . Broken back     during seizure    Past Surgical History  Procedure Laterality Date  . Tonsillectomy     No family history on file. History  Substance Use Topics  . Smoking status: Current Some Day Smoker  . Smokeless tobacco: Not on file  . Alcohol Use: Yes     Comment: occasionally   OB History   Grav Para Term Preterm Abortions TAB SAB Ect Mult Living   1              Review of Systems  Constitutional: Negative for fever, chills and fatigue.  HENT: Positive for sore throat. Negative for congestion, dental problem, drooling, ear pain, facial swelling, mouth sores, postnasal drip, rhinorrhea, trouble swallowing and voice change.   Eyes: Negative for pain.  Respiratory: Negative for cough, chest tightness and shortness of breath.   Cardiovascular: Negative for chest pain.  Gastrointestinal: Negative for nausea, vomiting and abdominal pain.  Musculoskeletal: Negative for neck pain and neck stiffness.  Skin: Negative for rash.  Neurological: Negative for facial asymmetry and headaches.  Hematological: Negative for adenopathy.  Psychiatric/Behavioral: The patient is not nervous/anxious.   All other systems reviewed and are negative.    Allergies  Wellbutrin  Home Medications   Current Outpatient Rx  Name  Route  Sig  Dispense  Refill  . clonazePAM (KLONOPIN) 0.5 MG tablet   Oral   Take 0.5 mg by mouth 2 (  two) times daily as needed for anxiety.         Marland Kitchen HYDROcodone-acetaminophen (HYCET) 7.5-325 mg/15 ml solution   Oral   Take 15 mLs by mouth 4 (four) times daily as needed for pain.   120 mL   0   . levonorgestrel-ethinyl estradiol (ENPRESSE,TRIVORA) tablet   Oral   Take 1 tablet by mouth daily.         . methylphenidate (RITALIN) 20 MG tablet   Oral   Take 20 mg by mouth 3 (three) times daily.         Marland Kitchen oxyCODONE (ROXICODONE) 5 MG/5ML solution   Oral   Take 5 mLs (5 mg total) by mouth every 4 (four) hours as needed for pain.   75 mL   0    BP 112/75  Pulse 71  Temp(Src) 98.4 F  (36.9 C) (Oral)  Resp 18  Ht 5' (1.524 m)  Wt 105 lb (47.628 kg)  BMI 20.51 kg/m2  SpO2 100%  LMP 07/17/2013 Physical Exam  Constitutional: She is oriented to person, place, and time. She appears well-developed and well-nourished. No distress.  HENT:  Head: Normocephalic and atraumatic.  Right Ear: Tympanic membrane, external ear and ear canal normal.  Left Ear: Tympanic membrane, external ear and ear canal normal.  Nose: No mucosal edema or rhinorrhea. No epistaxis. Right sinus exhibits no maxillary sinus tenderness and no frontal sinus tenderness. Left sinus exhibits no maxillary sinus tenderness and no frontal sinus tenderness.  Mouth/Throat: Uvula is midline and mucous membranes are normal. Mucous membranes are not pale and not cyanotic. No dental abscesses or uvula swelling. Posterior oropharyngeal erythema present. No oropharyngeal exudate, posterior oropharyngeal edema or tonsillar abscesses.  Surgically absent tonsils Oropharyngeal erythema without exudate or edema  Eyes: Conjunctivae are normal. Pupils are equal, round, and reactive to light.  Neck: Trachea normal, normal range of motion, full passive range of motion without pain and phonation normal.  Normal phonation - no muffled or hot potato voice Handling secretions without difficulty, no stridor, patent airway  Cardiovascular: Normal rate and intact distal pulses.   Pulmonary/Chest: Effort normal and breath sounds normal. No stridor.  Abdominal: Soft. Bowel sounds are normal. There is no tenderness.  Musculoskeletal: Normal range of motion.  Lymphadenopathy:       Head (right side): No submental, no submandibular, no tonsillar, no preauricular, no posterior auricular and no occipital adenopathy present.       Head (left side): No submental, no submandibular, no tonsillar, no preauricular, no posterior auricular and no occipital adenopathy present.    She has no cervical adenopathy.       Right cervical: No superficial  cervical, no deep cervical and no posterior cervical adenopathy present.      Left cervical: No superficial cervical, no deep cervical and no posterior cervical adenopathy present.       Right: No supraclavicular adenopathy present.       Left: No supraclavicular adenopathy present.  No cervical lymphadenopathy  Neurological: She is alert and oriented to person, place, and time.  Skin: Skin is dry. No rash noted. She is not diaphoretic.  Psychiatric: She has a normal mood and affect.    ED Course  Procedures (including critical care time)  8:18 PM- Pt advised of plan for treatment possible CT scan of neck, wit CBC and IV fluids and pain medication and pt agrees.   Labs Review Labs Reviewed - No data to display Imaging Review Ct Soft Tissue Neck  W Contrast  07/28/2013   CLINICAL DATA:  Progressive sore throat.  EXAM: CT NECK WITH CONTRAST  TECHNIQUE: Multidetector CT imaging of the neck was performed using the standard protocol following the bolus administration of intravenous contrast.  CONTRAST:  75mL OMNIPAQUE IOHEXOL 300 MG/ML  SOLN  COMPARISON:  05/23/2012  FINDINGS: No retropharyngeal fluid collection or gas. No mucosal lesion is evident. No adenopathy. Normal vascular enhancement. Visualized upper mediastinum and lung apices clear. Mucoperiosteal thickening in the left maxillary sinus. Regional bones unremarkable.  IMPRESSION: 1. Left maxillary sinus disease. 2. Negative for abscess or adenopathy.   Electronically Signed   By: Oley Balm M.D.   On: 07/28/2013 21:49    EKG Interpretation   None       MDM   1. Viral pharyngitis      Natalie [name] 24 hours after initial evaluation with worsening sore throat, statements that it feels as if it might be swelling and she's had voice changes. Low likelihood of epiglottitis will assess with CT. Physical exam with surgically absent tonsils and no exudate or lesion on the back of the throat. No evidence of herpangina.  Review  shows that on 07/27/2013 her rapid strep was negative and her throat culture has grown no bacteria.    10:35 PM Patient's throat CT negative for abscess or adenopathy. I personally reviewed the imaging tests through PACS system.  I reviewed available ER/hospitalization records through the EMR.  Will give Decadron IM here in the emergency department and discharged home with further pain medication. Pt afebrile without tonsillar exudate. Presents without cervical lymphadenopathy, muffled or hot potato voice but with dysphagia; diagnosis of viral pharyngitis. No abx indicated. DC w symptomatic tx for pain  Did discuss importance of water rehydration. Presentation non concerning for PTA or infxn spread to soft tissue. No trismus or uvula deviation. Specific return precautions discussed. Pt able to drink water in ED without difficulty with intact air way. Recommended PCP follow up.  It has been determined that no acute conditions requiring further emergency intervention are present at this time. The patient/guardian have been advised of the diagnosis and plan. We have discussed signs and symptoms that warrant return to the ED, such as changes or worsening in symptoms.   Vital signs are stable at discharge.   BP 112/75  Pulse 71  Temp(Src) 98.4 F (36.9 C) (Oral)  Resp 18  Ht 5' (1.524 m)  Wt 105 lb (47.628 kg)  BMI 20.51 kg/m2  SpO2 100%  LMP 07/17/2013  Patient/guardian has voiced understanding and agreed to follow-up with the PCP or specialist.       Dierdre Forth, PA-C 07/28/13 2239

## 2013-07-28 NOTE — ED Notes (Signed)
Pt was tx here on the 29 th for a sore throat.  Was told it was viral and given pain medication.  Pt states it is getting worse and it is increasingly difficult to talk.

## 2013-07-28 NOTE — ED Notes (Signed)
PA at bedside evaluating patient

## 2013-07-29 NOTE — ED Provider Notes (Signed)
Medical screening examination/treatment/procedure(s) were performed by non-physician practitioner and as supervising physician I was immediately available for consultation/collaboration.  Flint Melter, MD 07/29/13 [phone]

## 2013-12-30 ENCOUNTER — Emergency Department (HOSPITAL_COMMUNITY)
Admission: EM | Admit: 2013-12-30 | Discharge: 2013-12-30 | Disposition: A | Discharge status: Home / Self Care | Payer: Medicaid Other | Attending: Emergency Medicine | Admitting: Emergency Medicine

## 2013-12-30 ENCOUNTER — Encounter (HOSPITAL_COMMUNITY): Payer: Self-pay | Admitting: Emergency Medicine

## 2013-12-30 ENCOUNTER — Emergency Department (HOSPITAL_COMMUNITY): Payer: Medicaid Other

## 2013-12-30 DIAGNOSIS — R011 Cardiac murmur, unspecified: Secondary | ICD-10-CM | POA: Insufficient documentation

## 2013-12-30 DIAGNOSIS — M545 Low back pain, unspecified: Secondary | ICD-10-CM | POA: Insufficient documentation

## 2013-12-30 DIAGNOSIS — F17200 Nicotine dependence, unspecified, uncomplicated: Secondary | ICD-10-CM | POA: Insufficient documentation

## 2013-12-30 DIAGNOSIS — M549 Dorsalgia, unspecified: Secondary | ICD-10-CM

## 2013-12-30 DIAGNOSIS — Z87311 Personal history of (healed) other pathological fracture: Secondary | ICD-10-CM | POA: Insufficient documentation

## 2013-12-30 DIAGNOSIS — R569 Unspecified convulsions: Secondary | ICD-10-CM | POA: Insufficient documentation

## 2013-12-30 DIAGNOSIS — M62830 Muscle spasm of back: Secondary | ICD-10-CM

## 2013-12-30 DIAGNOSIS — Z888 Allergy status to other drugs, medicaments and biological substances status: Secondary | ICD-10-CM | POA: Insufficient documentation

## 2013-12-30 DIAGNOSIS — F410 Panic disorder [episodic paroxysmal anxiety] without agoraphobia: Secondary | ICD-10-CM | POA: Insufficient documentation

## 2013-12-30 DIAGNOSIS — Z79899 Other long term (current) drug therapy: Secondary | ICD-10-CM | POA: Insufficient documentation

## 2013-12-30 DIAGNOSIS — M5380 Other specified dorsopathies, site unspecified: Secondary | ICD-10-CM | POA: Insufficient documentation

## 2013-12-30 MED ORDER — METHOCARBAMOL 500 MG PO TABS
1000.0000 mg | ORAL_TABLET | Freq: Once | ORAL | Status: AC
  Administered 2013-12-30: 1000 mg via ORAL
  Filled 2013-12-30: qty 2

## 2013-12-30 MED ORDER — METHOCARBAMOL 500 MG PO TABS: 1000.0000 mg | ORAL_TABLET | Freq: Four times a day (QID) | ORAL | Status: DC

## 2013-12-30 MED ORDER — KETOROLAC TROMETHAMINE 60 MG/2ML IM SOLN
60.0000 mg | Freq: Once | INTRAMUSCULAR | Status: AC
  Administered 2013-12-30: 60 mg via INTRAMUSCULAR
  Filled 2013-12-30: qty 2

## 2013-12-30 MED ORDER — NAPROXEN 500 MG PO TABS: 500.0000 mg | ORAL_TABLET | Freq: Two times a day (BID) | ORAL | Status: DC

## 2013-12-30 MED ORDER — OXYCODONE-ACETAMINOPHEN 5-325 MG PO TABS
1.0000 | ORAL_TABLET | Freq: Once | ORAL | Status: AC
  Administered 2013-12-30: 1 via ORAL
  Filled 2013-12-30: qty 1

## 2013-12-30 NOTE — ED Notes (Signed)
Pt has hx of back injury 3 yrs ago. C/o  Mid to lower back pain x 2 weeks. No further injury.

## 2013-12-30 NOTE — ED Provider Notes (Signed)
CSN: [id number]     Arrival date & time 12/30/13  1428 History   First MD Initiated Contact with Patient 12/30/13 1501     Chief Complaint  Patient presents with  . Back Pain     (Consider location/radiation/quality/duration/timing/severity/associated sxs/prior Treatment) HPI Comments: Patient with history of T-spine fracture after seizure several years ago presents with complaint of lower back pain and mid back pain. Patient states that she has chronic lower back soreness that is intermittent. This pain has been present for approximately 2 weeks and is similar to past pain. It is worse with movement and picking up her daughter. Patient has been taking Tylenol in the mornings without relief. For the past several days patient has had some sharper pain that is unusual for her in her left middle back. She denies pain with deep breathing or cough. No shortness of breath or chest pain. No fevers. Pain is also worse with movement. She is concerned that she is having a problem associated with her previous fracture. The onset of this condition was acute. The course is constant. Aggravating factors: none. Alleviating factors: none. Patient denies red flag signs and symptoms of lower back pain.     The history is provided by the patient.    Past Medical History  Diagnosis Date  . Panic attacks   . Heart murmur   . Seizures   . Broken back     during seizure   Past Surgical History  Procedure Laterality Date  . Tonsillectomy     History reviewed. No pertinent family history. History  Substance Use Topics  . Smoking status: Current Some Day Smoker  . Smokeless tobacco: Not on file  . Alcohol Use: Yes     Comment: occasionally   OB History   Grav Para Term Preterm Abortions TAB SAB Ect Mult Living   1              Review of Systems  Constitutional: Negative for fever and unexpected weight change.  Gastrointestinal: Negative for constipation.       Negative for fecal incontinence.     Genitourinary: Negative for dysuria, hematuria, flank pain, vaginal bleeding, vaginal discharge and pelvic pain.       Negative for urinary incontinence or retention.  Musculoskeletal: Positive for back pain.  Neurological: Negative for weakness and numbness.       Denies saddle paresthesias.    Allergies  Wellbutrin  Home Medications   Current Outpatient Rx  Name  Route  Sig  Dispense  Refill  . acetaminophen (TYLENOL) 325 MG tablet   Oral   Take 650 mg by mouth every 6 (six) hours as needed for mild pain.         . clonazePAM (KLONOPIN) 0.5 MG tablet   Oral   Take 0.5 mg by mouth 2 (two) times daily as needed for anxiety.         Marland Kitchen levonorgestrel-ethinyl estradiol (ENPRESSE,TRIVORA) tablet   Oral   Take 1 tablet by mouth daily.         . methylphenidate (RITALIN) 20 MG tablet   Oral   Take 20 mg by mouth 3 (three) times daily.         . methocarbamol (ROBAXIN) 500 MG tablet   Oral   Take 2 tablets (1,000 mg total) by mouth 4 (four) times daily.   20 tablet   0   . naproxen (NAPROSYN) 500 MG tablet   Oral   Take 1 tablet (500  mg total) by mouth 2 (two) times daily.   20 tablet   0    BP 144/81  Pulse 80  Temp(Src) 97.4 F (36.3 C) (Oral)  Resp 20  Ht 5\' 1"  (1.549 m)  Wt 105 lb (47.628 kg)  BMI 19.85 kg/m2  SpO2 100%  Physical Exam  Nursing note and vitals reviewed. Constitutional: She appears well-developed and well-nourished.  HENT:  Head: Normocephalic and atraumatic.  Eyes: Conjunctivae are normal.  Neck: Normal range of motion. Neck supple.  Pulmonary/Chest: Effort normal.  Abdominal: Soft. There is no tenderness. There is no CVA tenderness.  Musculoskeletal: Normal range of motion.       Cervical back: She exhibits normal range of motion, no tenderness and no bony tenderness.       Thoracic back: She exhibits tenderness and spasm. She exhibits normal range of motion and no bony tenderness.       Lumbar back: She exhibits tenderness. She  exhibits normal range of motion and no bony tenderness.       Back:  No step-off noted with palpation of spine.   Neurological: She is alert. She has normal strength and normal reflexes. No sensory deficit.  5/5 strength in entire lower extremities bilaterally. No sensation deficit.   Skin: Skin is warm and dry. No rash noted.  Psychiatric: She has a normal mood and affect.    ED Course  Procedures (including critical care time) Labs Review Labs Reviewed - No data to display Imaging Review Dg Thoracic Spine 2 View  12/30/2013   CLINICAL DATA:  Back pain  EXAM: THORACIC SPINE - 2 VIEW  COMPARISON:  None.  FINDINGS: Three views of thoracic spine submitted. There is no acute fracture or subluxation. Dextroscoliosis of lower thoracic spine with Cobb angle measuring about 20 degrees.  IMPRESSION: No acute fracture or subluxation. Dextroscoliosis with Cobb angle measuring about 20 degrees.   Electronically Signed   By: Natasha Mead M.D.   On: 12/30/2013 16:46     EKG Interpretation None      4:08 PM Patient seen and examined. Medications ordered.   Vital signs reviewed and are as follows: Filed Vitals:   12/30/13 1429  BP: 144/81  Pulse: 80  Temp: 97.4 F (36.3 C)  Resp: 20    No red flag s/s of low back pain. Patient was counseled on back pain precautions and told to do activity as tolerated but do not lift, push, or pull heavy objects more than 10 pounds for the next week.  Patient counseled to use ice or heat on back for no longer than 15 minutes every hour.   Patient prescribed muscle relaxer and counseled on proper use of muscle relaxant medication.    Urged patient not to drink alcohol, drive, or perform any other activities that requires focus while taking either of these medications.  Patient urged to follow-up with PCP if pain does not improve with treatment and rest or if pain becomes recurrent. Urged to return with worsening severe pain, loss of bowel or bladder control,  trouble walking.   The patient verbalizes understanding and agrees with the plan.   MDM   Final diagnoses:  Back spasm  Back pain   Patient with back pain with palpable muscle spasm. No neurological deficits. Patient is ambulatory. No warning symptoms of back pain including: loss of bowel or bladder control, night sweats, waking from sleep with back pain, unexplained fevers or weight loss, h/o cancer, IVDU, recent trauma. No concern  for cauda equina, epidural abscess, or other serious cause of back pain. Do not suspect PE, PNA, or other pulmonary etiology. Conservative measures such as rest, ice/heat and pain medicine indicated with PCP follow-up if no improvement with conservative management.      Renne CriglerJoshua Anahla Bevis, PA-C 12/30/13 1714

## 2013-12-30 NOTE — Discharge Instructions (Signed)
Please read and follow all provided instructions.  Your diagnoses today include:  1. Back spasm   2. Back pain     Tests performed today include:  Vital signs - see below for your results today  Medications prescribed:   Robaxin (methocarbamol) - muscle relaxer medication  DO NOT drive or perform any activities that require you to be awake and alert because this medicine can make you drowsy.    Naproxen - anti-inflammatory pain medication  Do not exceed 500mg  naproxen every 12 hours, take with food  You have been prescribed an anti-inflammatory medication or NSAID. Take with food. Take smallest effective dose for the shortest duration needed for your pain. Stop taking if you experience stomach pain or vomiting.   Take any prescribed medications only as directed.  Home care instructions:   Follow any educational materials contained in this packet  Please rest, use ice or heat on your back for the next several days  Do not lift, push, pull anything more than 10 pounds for the next week  Follow-up instructions: Please follow-up with your primary care provider in the next 1 week for further evaluation of your symptoms. If you do not have a primary care doctor -- see below for referral information.   Return instructions:  SEEK IMMEDIATE MEDICAL ATTENTION IF YOU HAVE:  New numbness, tingling, weakness, or problem with the use of your arms or legs  Severe back pain not relieved with medications  Loss control of your bowels or bladder  Increasing pain in any areas of the body (such as chest or abdominal pain)  Shortness of breath, dizziness, or fainting.   Worsening nausea (feeling sick to your stomach), vomiting, fever, or sweats  Any other emergent concerns regarding your health   Additional Information:  Your vital signs today were: BP 144/81   Pulse 80   Temp(Src) 97.4 F (36.3 C) (Oral)   Resp 20   Ht 5\' 1"  (1.549 m)   Wt 105 lb (47.628 kg)   BMI 19.85 kg/m2    SpO2 100% If your blood pressure (BP) was elevated above 135/85 this visit, please have this repeated by your doctor within one month. --------------

## 2013-12-30 NOTE — ED Notes (Signed)
Pt now requesting imagining now. PA made aware. Orders to follow.

## 2013-12-30 NOTE — ED Notes (Signed)
Pt in c/o mid back pain, states she had an old injury to that area and thinks she re-injured it when picking up her daughter, pain x2 weeks with lifting or quick movement

## 2013-12-31 NOTE — ED Provider Notes (Signed)
Medical screening examination/treatment/procedure(s) were performed by non-physician practitioner and as supervising physician I was immediately available for consultation/collaboration.   EKG Interpretation None        Tamecia Mcdougald S Kaloni Bisaillon, MD 12/31/13 1245 

## 2014-07-31 ENCOUNTER — Encounter (HOSPITAL_COMMUNITY): Payer: Self-pay | Admitting: Emergency Medicine

## 2015-09-20 ENCOUNTER — Emergency Department (HOSPITAL_BASED_OUTPATIENT_CLINIC_OR_DEPARTMENT_OTHER): Payer: 59

## 2015-09-20 ENCOUNTER — Emergency Department (HOSPITAL_BASED_OUTPATIENT_CLINIC_OR_DEPARTMENT_OTHER)
Admission: EM | Admit: 2015-09-20 | Discharge: 2015-09-20 | Disposition: A | Discharge status: Home / Self Care | Payer: 59 | Attending: Emergency Medicine | Admitting: Emergency Medicine

## 2015-09-20 ENCOUNTER — Encounter (HOSPITAL_BASED_OUTPATIENT_CLINIC_OR_DEPARTMENT_OTHER): Payer: Self-pay

## 2015-09-20 DIAGNOSIS — L03031 Cellulitis of right toe: Secondary | ICD-10-CM | POA: Insufficient documentation

## 2015-09-20 DIAGNOSIS — L03011 Cellulitis of right finger: Secondary | ICD-10-CM

## 2015-09-20 DIAGNOSIS — Z8659 Personal history of other mental and behavioral disorders: Secondary | ICD-10-CM | POA: Insufficient documentation

## 2015-09-20 DIAGNOSIS — R011 Cardiac murmur, unspecified: Secondary | ICD-10-CM | POA: Insufficient documentation

## 2015-09-20 DIAGNOSIS — F17200 Nicotine dependence, unspecified, uncomplicated: Secondary | ICD-10-CM | POA: Insufficient documentation

## 2015-09-20 DIAGNOSIS — Z87828 Personal history of other (healed) physical injury and trauma: Secondary | ICD-10-CM | POA: Insufficient documentation

## 2015-09-20 MED ORDER — HYDROCODONE-ACETAMINOPHEN 5-325 MG PO TABS: 1.0000 | ORAL_TABLET | ORAL | Status: DC | PRN

## 2015-09-20 MED ORDER — CEPHALEXIN 250 MG PO CAPS
500.0000 mg | ORAL_CAPSULE | Freq: Once | ORAL | Status: AC
Start: 2015-09-20 — End: 2015-09-20
  Administered 2015-09-20: 500 mg via ORAL
  Filled 2015-09-20: qty 2

## 2015-09-20 MED ORDER — HYDROCODONE-ACETAMINOPHEN 5-325 MG PO TABS
1.0000 | ORAL_TABLET | Freq: Once | ORAL | Status: AC
Start: 2015-09-20 — End: 2015-09-20
  Administered 2015-09-20: 1 via ORAL
  Filled 2015-09-20: qty 1

## 2015-09-20 MED ORDER — LIDOCAINE HCL (PF) 1 % IJ SOLN
10.0000 mL | Freq: Once | INTRAMUSCULAR | Status: DC
  Filled 2015-09-20: qty 10

## 2015-09-20 MED ORDER — LIDOCAINE HCL (PF) 1 % IJ SOLN
30.0000 mL | Freq: Once | INTRAMUSCULAR | Status: DC
  Filled 2015-09-20: qty 30

## 2015-09-20 MED ORDER — CEPHALEXIN 500 MG PO CAPS: 500.0000 mg | ORAL_CAPSULE | Freq: Four times a day (QID) | ORAL | Status: DC

## 2015-09-20 MED ORDER — LIDOCAINE HCL (PF) 1 % IJ SOLN
INTRAMUSCULAR | Status: AC
  Administered 2015-09-20: 10 mL
  Filled 2015-09-20: qty 5

## 2015-09-20 NOTE — ED Provider Notes (Signed)
CSN: [id number]     Arrival date & time 09/20/15  1546 History   First MD Initiated Contact with Patient 09/20/15 1622     Chief Complaint  Patient presents with  . Toe Injury      HPI  Patient presents for evaluation of right great toe pain. She did not injure her toe. However 4 days ago at work she noticed that her toe was painful. He's been intermittently draining since that time it is red and swollen she presents here.  Past Medical History  Diagnosis Date  . Panic attacks   . Heart murmur   . Seizures (HCC)   . Broken back     during seizure   Past Surgical History  Procedure Laterality Date  . Tonsillectomy     No family history on file. Social History  Substance Use Topics  . Smoking status: Current Some Day Smoker  . Smokeless tobacco: None  . Alcohol Use: Yes     Comment: occasionally   OB History    Gravida Para Term Preterm AB TAB SAB Ectopic Multiple Living   1              Review of Systems  Constitutional: Negative for fever, chills, diaphoresis, appetite change and fatigue.  HENT: Negative for mouth sores, sore throat and trouble swallowing.   Eyes: Negative for visual disturbance.  Respiratory: Negative for cough, chest tightness, shortness of breath and wheezing.   Cardiovascular: Negative for chest pain.  Gastrointestinal: Negative for nausea, vomiting, abdominal pain, diarrhea and abdominal distention.  Endocrine: Negative for polydipsia, polyphagia and polyuria.  Genitourinary: Negative for dysuria, frequency and hematuria.  Musculoskeletal: Negative for gait problem.  Skin: Positive for color change. Negative for pallor and rash.       Redness pain and drainage from the right great toe at the nail laterally  Neurological: Negative for dizziness, syncope, light-headedness and headaches.  Hematological: Does not bruise/bleed easily.  Psychiatric/Behavioral: Negative for behavioral problems and confusion.      Allergies  Wellbutrin  Home  Medications   Prior to Admission medications   Medication Sig Start Date End Date Taking? Authorizing Provider  cephALEXin (KEFLEX) 500 MG capsule Take 1 capsule (500 mg total) by mouth 4 (four) times daily. 09/20/15   Rolland PorterMark Jya Hughston, MD  HYDROcodone-acetaminophen (NORCO/VICODIN) 5-325 MG tablet Take 1 tablet by mouth every 4 (four) hours as needed. 09/20/15   Rolland PorterMark Oluwatosin Bracy, MD   BP 129/81 mmHg  Pulse 72  Temp(Src) 97.6 F (36.4 C) (Oral)  Resp 18  Ht 5' (1.524 m)  Wt 110 lb (49.896 kg)  BMI 21.48 kg/m2  SpO2 100%  LMP 09/08/2015 Physical Exam  Constitutional: She is oriented to person, place, and time. She appears well-developed and well-nourished. No distress.  HENT:  Head: Normocephalic.  Eyes: Conjunctivae are normal. Pupils are equal, round, and reactive to light. No scleral icterus.  Neck: Normal range of motion. Neck supple. No thyromegaly present.  Cardiovascular: Normal rate and regular rhythm.  Exam reveals no gallop and no friction rub.   No murmur heard. Pulmonary/Chest: Effort normal and breath sounds normal. No respiratory distress. She has no wheezes. She has no rales.  Abdominal: Soft. Bowel sounds are normal. She exhibits no distension. There is no tenderness. There is no rebound.  Musculoskeletal: Normal range of motion.       Feet:  Neurological: She is alert and oriented to person, place, and time.  Skin: Skin is warm and dry. No rash  noted.  Psychiatric: She has a normal mood and affect. Her behavior is normal.    ED Course  Procedures (including critical care time) Labs Review Labs Reviewed - No data to display  Imaging Review Dg Toe Great Right  09/20/2015  CLINICAL DATA:  Right great toe injury 4 days ago EXAM: RIGHT GREAT TOE COMPARISON:  None FINDINGS: Three views of the right first toe submitted. No acute fracture or subluxation. No radiopaque foreign body. IMPRESSION: Negative. Electronically Signed   By: Natasha Mead M.D.   On: 09/20/2015 16:17   I  have personally reviewed and evaluated these images and lab results as part of my medical decision-making.   EKG Interpretation None      MDM   Final diagnoses:  Paronychia, right    INCISION AND DRAINAGE Performed by: Claudean Kinds Consent: Verbal consent obtained. Risks and benefits: risks, benefits and alternatives were discussed Type: abscess  Body area: right great toe  Anesthesia: local infiltration  Incision was made with a scalpel.  Local anesthetic: lidocaine %  epinephrine  Anesthetic total: 4 ml  Complexity: complex Blunt dissection to break up loculations  Drainage: purulent  Drainage amount: scant  Packing material: 1/4 in iodoform gauze  Patient tolerance: Patient tolerated the procedure well with no immediate complications.       Rolland Porter, MD 09/20/15 [id number]

## 2015-09-20 NOTE — ED Notes (Signed)
Injured right great toe Sunday

## 2015-09-20 NOTE — Discharge Instructions (Signed)
Paronychia  Soak your foot, and remove the gauze in 2 days.   Paronychia is an infection of the skin. It happens near a fingernail or toenail. It may cause pain and swelling around the nail. Usually, it is not serious and it clears up with treatment. HOME CARE  Soak the fingers or toes in warm water as told by your doctor. You may be told to do this for 20 minutes, 2-3 times a day.  Keep the area dry when you are not soaking it.  Take medicines only as told by your doctor.  If you were given an antibiotic medicine, finish all of it even if you start to feel better.  Keep the affected area clean.  Do not try to drain a fluid-filled bump yourself.  Wear rubber gloves when putting your hands in water.  Wear gloves if your hands might touch cleaners or chemicals.  Follow your doctor's instructions about:  Wound care.  Bandage (dressing) changes and removal. GET HELP IF:  Your symptoms get worse or do not improve.  You have a fever or chills.  You have redness spreading from the affected area.  You have more fluid, blood, or pus coming from the affected area.  Your finger or knuckle is swollen or is hard to move.   This information is not intended to replace advice given to you by your health care provider. Make sure you discuss any questions you have with your health care provider.   Document Released: 09/03/2009 Document Revised: 01/30/2015 Document Reviewed: 08/23/2014 Elsevier Interactive Patient Education [employer]2016 [employer] Inc[employer]

## 2016-04-23 ENCOUNTER — Emergency Department (HOSPITAL_BASED_OUTPATIENT_CLINIC_OR_DEPARTMENT_OTHER)
Admission: EM | Admit: 2016-04-23 | Discharge: 2016-04-23 | Disposition: A | Discharge status: Home / Self Care | Payer: 59 | Attending: Emergency Medicine | Admitting: Emergency Medicine

## 2016-04-23 ENCOUNTER — Encounter (HOSPITAL_BASED_OUTPATIENT_CLINIC_OR_DEPARTMENT_OTHER): Payer: Self-pay

## 2016-04-23 DIAGNOSIS — H6980 Other specified disorders of Eustachian tube, unspecified ear: Secondary | ICD-10-CM

## 2016-04-23 DIAGNOSIS — J029 Acute pharyngitis, unspecified: Secondary | ICD-10-CM | POA: Insufficient documentation

## 2016-04-23 DIAGNOSIS — F17200 Nicotine dependence, unspecified, uncomplicated: Secondary | ICD-10-CM | POA: Insufficient documentation

## 2016-04-23 DIAGNOSIS — H6990 Unspecified Eustachian tube disorder, unspecified ear: Secondary | ICD-10-CM | POA: Insufficient documentation

## 2016-04-23 LAB — RAPID STREP SCREEN (MED CTR MEBANE ONLY): STREPTOCOCCUS, GROUP A SCREEN (DIRECT): NEGATIVE

## 2016-04-23 MED ORDER — IBUPROFEN 800 MG PO TABS: 800.0000 mg | ORAL_TABLET | Freq: Three times a day (TID) | ORAL | 0 refills | Status: AC | PRN

## 2016-04-23 MED ORDER — PSEUDOEPHEDRINE HCL 60 MG PO TABS: 60.0000 mg | ORAL_TABLET | Freq: Four times a day (QID) | ORAL | 0 refills | Status: AC | PRN

## 2016-04-23 MED ORDER — FLUTICASONE PROPIONATE 50 MCG/ACT NA SUSP: 2.0000 | Freq: Every day | NASAL | 0 refills | Status: AC

## 2016-04-23 MED ORDER — KETOROLAC TROMETHAMINE 30 MG/ML IJ SOLN
30.0000 mg | Freq: Once | INTRAMUSCULAR | Status: AC
  Administered 2016-04-23: 30 mg via INTRAMUSCULAR
  Filled 2016-04-23: qty 1

## 2016-04-23 MED ORDER — DIPHENHYDRAMINE HCL 25 MG PO CAPS: 25.0000 mg | ORAL_CAPSULE | Freq: Four times a day (QID) | ORAL | 0 refills | Status: AC | PRN

## 2016-04-23 MED FILL — SUDOGEST 60 MG TABLET: 60 | 7 days supply | Qty: 30 | Fill #0

## 2016-04-23 NOTE — ED Triage Notes (Signed)
C/o fever, sore throat, body aches, decreased hearing-NAD-steady gait

## 2016-04-23 NOTE — Discharge Instructions (Signed)
Read the information below.  Use the prescribed medication as directed.  Please discuss all new medications with your pharmacist.  You may return to the Emergency Department at any time for worsening condition or any new symptoms that concern you.   If you develop high fevers, difficulty swallowing or breathing, or you are unable to tolerate fluids by mouth, return to the ER immediately for a recheck.    °

## 2016-04-23 NOTE — ED Provider Notes (Signed)
[facility]   CSN: [id number] Arrival date & time: 04/23/16  1118  First Provider Contact:  First MD Initiated Contact with Patient 04/23/16 1151        History   Chief Complaint Chief Complaint  Patient presents with  . Fever    HPI Natalie [name] is a 30 y.o. female.  HPI   Pt presents with three days of subjective fever, myalgias, sore throat, and now with bilateral decreased hearing and ringing in the right ear.  Has taken aspirin and motrin without improvement.  Took 4 total doses of aspirin Monday to Tuesday and then began motrin only. Denies cough, SOB, nasal symptoms.  Is s/p tonsillectomy approximately 10 years ago due to recurrent strep infections.    Past Medical History:  Diagnosis Date  . Broken back    during seizure  . Heart murmur   . Panic attacks   . Seizures (HCC)     There are no active problems to display for this patient.   Past Surgical History:  Procedure Laterality Date  . TONSILLECTOMY      OB History    Gravida Para Term Preterm AB Living   1             SAB TAB Ectopic Multiple Live Births                   Home Medications    Prior to Admission medications   Not on File    Family History No family history on file.  Social History Social History  Substance Use Topics  . Smoking status: Current Some Day Smoker  . Smokeless tobacco: Never Used  . Alcohol use Yes     Comment: occasionally     Allergies   Wellbutrin [bupropion hcl]   Review of Systems Review of Systems  Constitutional: Positive for fatigue and fever.  HENT: Positive for hearing loss and sore throat. Negative for ear discharge, ear pain, facial swelling, rhinorrhea, sinus pressure and trouble swallowing.   Respiratory: Negative for cough and shortness of breath.   Musculoskeletal: Positive for myalgias. Negative for neck pain and neck stiffness.  Skin: Negative for rash.  Allergic/Immunologic: Negative for  immunocompromised state.     Physical Exam Updated Vital Signs BP 117/81 (BP Location: Left Arm)   Pulse 88   Temp 97.8 F (36.6 C) (Oral)   Resp 18   Ht 5' (1.524 m)   Wt 49.9 kg   LMP 04/14/2016   SpO2 100%   BMI 21.48 kg/m   Physical Exam  Constitutional: She appears well-developed and well-nourished. No distress.  HENT:  Head: Normocephalic and atraumatic.  Right Ear: Tympanic membrane and ear canal normal.  Left Ear: Tympanic membrane and ear canal normal.  Mouth/Throat: Mucous membranes are not dry. No trismus in the jaw. No uvula swelling. Posterior oropharyngeal erythema present. No oropharyngeal exudate or posterior oropharyngeal edema.  Tonsils absent.  Bilateral TMs clear, no erythema.  ?fluid   Eyes: Conjunctivae are normal.  Neck: Neck supple.  Cardiovascular: Normal rate and regular rhythm.   Pulmonary/Chest: Effort normal and breath sounds normal. No respiratory distress. She has no wheezes. She has no rales.  Neurological: She is alert.  Skin: She is not diaphoretic.  Nursing note and vitals reviewed.    ED Treatments / Results  Labs (all labs ordered are listed, but only abnormal results are displayed) Labs Reviewed  RAPID STREP SCREEN (NOT AT [facility])  CULTURE, GROUP A  STREP [facility])    EKG  EKG Interpretation None       Radiology No results found.  Procedures Procedures (including critical care time)  Medications Ordered in ED Medications - No data to display   Initial Impression / Assessment and Plan / ED Course  I have reviewed the triage vital signs and the nursing notes.  Pertinent labs & imaging results that were available during my care of the patient were reviewed by me and considered in my medical decision making (see chart for details).  Clinical Course    Afebrile, nontoxic patient with constellation of symptoms suggestive of viral syndrome.  No concerning findings on exam.  Strep screen negative.   Pt c/o significant  myalgias.  Would be too early to test for mono - pt made aware of this possibility if the symptoms continue.  No lymphadenopathy or abdominal tenderness or noted organomegaly.  Discharged home with supportive care, PCP follow up.  Discussed result, findings, treatment, and follow up  with patient.  Pt given return precautions.  Pt verbalizes understanding and agrees with plan.      Final Clinical Impressions(s) / ED Diagnoses   Final diagnoses:  Pharyngitis  Eustachian tube dysfunction, unspecified laterality    New Prescriptions Discharge Medication List as of 04/23/2016 12:42 PM    START taking these medications   Details  diphenhydrAMINE (BENADRYL) 25 mg capsule Take 1 capsule (25 mg total) by mouth every 6 (six) hours as needed (eustachian tube dysfunction)., Starting Wed 04/23/2016, Print    fluticasone (FLONASE) 50 MCG/ACT nasal spray Place 2 sprays into both nostrils daily., Starting Wed 04/23/2016, Print    ibuprofen (ADVIL,MOTRIN) 800 MG tablet Take 1 tablet (800 mg total) by mouth every 8 (eight) hours as needed., Starting Wed 04/23/2016, Print    pseudoephedrine (SUDAFED) 60 MG tablet Take 1 tablet (60 mg total) by mouth every 6 (six) hours as needed for congestion., Starting Wed 04/23/2016, Print         [place], [place] 04/23/16 1323    Jacalyn Lefevre, MD 04/25/16 1556

## 2016-04-25 LAB — CULTURE, GROUP A STREP (THRC)

## 2016-08-06 ENCOUNTER — Encounter (HOSPITAL_BASED_OUTPATIENT_CLINIC_OR_DEPARTMENT_OTHER): Payer: Self-pay | Admitting: Emergency Medicine

## 2016-08-06 ENCOUNTER — Emergency Department (HOSPITAL_BASED_OUTPATIENT_CLINIC_OR_DEPARTMENT_OTHER)
Admission: EM | Admit: 2016-08-06 | Discharge: 2016-08-06 | Disposition: A | Discharge status: Home / Self Care | Payer: 59 | Attending: Emergency Medicine | Admitting: Emergency Medicine

## 2016-08-06 DIAGNOSIS — F17200 Nicotine dependence, unspecified, uncomplicated: Secondary | ICD-10-CM | POA: Insufficient documentation

## 2016-08-06 DIAGNOSIS — H60503 Unspecified acute noninfective otitis externa, bilateral: Secondary | ICD-10-CM | POA: Insufficient documentation

## 2016-08-06 MED ORDER — NEOMYCIN-POLYMYXIN-HC 3.5-10000-1 OT SOLN: 4.0000 [drp] | Freq: Four times a day (QID) | OTIC | 0 refills | Status: AC

## 2016-08-06 MED ORDER — ACETAMINOPHEN 325 MG PO TABS
650.0000 mg | ORAL_TABLET | Freq: Once | ORAL | Status: AC
  Administered 2016-08-06: 650 mg via ORAL
  Filled 2016-08-06: qty 2

## 2016-08-06 NOTE — ED Notes (Signed)
ED Provider at bedside. 

## 2016-08-06 NOTE — ED Triage Notes (Signed)
Patient reports that she is having bilateral ear sensitivity and having pink conjunctiva ( noted in triage) starting about 4 days ago

## 2016-08-06 NOTE — Discharge Instructions (Signed)
You were seen in the ED for ear pain and red eyes. Your ear pain is likely from a bacteria, we have prescribed you ear drops. Please use as prescribed. Your red eyes will likely improve over the next week. Please see your PCP if your symptoms worsen. Take care!

## 2016-08-06 NOTE — ED Provider Notes (Signed)
[facility]   CSN: [id number] Arrival date & time: 08/06/16  1758   History   Chief Complaint Chief Complaint  Patient presents with  . Otalgia   HPI   Natalie [name] is a 30 y.o. female who presents with a 2 day history of bilateral ear pain as well as some eye redness. Patient states that it is painful to touch the outside of her ears. She feels like the pain starts from inside the ears. She denies any fevers, sore throat, runny nose, cough. She states that she has not put any foreign objects in her ears. Denies any recent swimming in lakes, pools, or  Oceans. Admits that she has had some decreased hearing in her left ear since the onset of the pain. No sick contacts. She notes that she has had ear infections before. Denies any ear or eye discharge.   Past Medical History:  Diagnosis Date  . Broken back    during seizure  . Heart murmur   . Panic attacks   . Seizures (HCC)     There are no active problems to display for this patient.   Past Surgical History:  Procedure Laterality Date  . TONSILLECTOMY      OB History    Gravida Para Term Preterm AB Living   1             SAB TAB Ectopic Multiple Live Births                   Home Medications    Prior to Admission medications   Medication Sig Start Date End Date Taking? Authorizing Provider  diphenhydrAMINE (BENADRYL) 25 mg capsule Take 1 capsule (25 mg total) by mouth every 6 (six) hours as needed (eustachian tube dysfunction). 04/23/16   Trixie DredgeEmily West, PA-C  fluticasone (FLONASE) 50 MCG/ACT nasal spray Place 2 sprays into both nostrils daily. 04/23/16   Trixie DredgeEmily West, PA-C  ibuprofen (ADVIL,MOTRIN) 800 MG tablet Take 1 tablet (800 mg total) by mouth every 8 (eight) hours as needed. 04/23/16   Trixie DredgeEmily West, PA-C  neomycin-polymyxin-hydrocortisone (CORTISPORIN) otic solution Place 4 drops into both ears 4 (four) times daily. 08/06/16   Beaulah Dinninghristina M Gambino, MD  pseudoephedrine (SUDAFED) 60 MG tablet  Take 1 tablet (60 mg total) by mouth every 6 (six) hours as needed for congestion. 04/23/16   Trixie DredgeEmily West, PA-C    Family History History reviewed. No pertinent family history.  Social History Social History  Substance Use Topics  . Smoking status: Current Some Day Smoker  . Smokeless tobacco: Never Used  . Alcohol use Yes     Comment: occasionally     Allergies   Wellbutrin [bupropion hcl]   Review of Systems Review of Systems  Constitutional: Negative for chills, fatigue and fever.  HENT: Positive for ear pain and hearing loss. Negative for congestion, dental problem, ear discharge, nosebleeds, postnasal drip, rhinorrhea, sinus pain, sneezing and sore throat.   Eyes: Positive for redness (bilateral). Negative for photophobia, pain, discharge, itching and visual disturbance.  Respiratory: Negative for cough, shortness of breath, wheezing and stridor.   Cardiovascular: Negative for chest pain, palpitations and leg swelling.  Gastrointestinal: Negative for abdominal distention, abdominal pain, diarrhea, nausea and vomiting.  Endocrine: Negative for polyuria.  Genitourinary: Negative for dysuria and flank pain.  Musculoskeletal: Negative for back pain and myalgias.  Skin: Negative for rash.  Neurological: Negative for speech difficulty, weakness and headaches.  Psychiatric/Behavioral: The patient is not nervous/anxious.  Physical Exam Updated Vital Signs BP 144/93 (BP Location: Right Arm)   Pulse 71   Temp 97.8 F (36.6 C) (Oral)   Resp 16   Ht 5' (1.524 m)   Wt 52.2 kg   LMP 06/29/2016   SpO2 100%   BMI 22.46 kg/m   Physical Exam  Constitutional: She is oriented to person, place, and time. She appears well-developed and well-nourished. No distress.  HENT:  Head: Normocephalic and atraumatic.  Right Ear: Tympanic membrane normal. No lacerations. There is drainage, swelling and tenderness. No foreign bodies. No mastoid tenderness.  Left Ear: Tympanic membrane  normal. No lacerations. There is swelling and tenderness. No drainage. No foreign bodies. No mastoid tenderness.  Ears:  Nose: Nose normal.  Mouth/Throat: Oropharynx is clear and moist.  Abrasion in right ear canal. White exudate in bilateral auditory canals  Eyes: Conjunctivae, EOM and lids are normal. Pupils are equal, round, and reactive to light.  Scleral redness bilaterally.   Neck: Normal range of motion. Neck supple.  Cardiovascular: Normal rate, regular rhythm, normal heart sounds and intact distal pulses.   Pulmonary/Chest: Effort normal and breath sounds normal. She has no wheezes.  Abdominal: Soft. Bowel sounds are normal. There is no tenderness.  Musculoskeletal: Normal range of motion. She exhibits no edema.  Neurological: She is alert and oriented to person, place, and time. No sensory deficit. She exhibits normal muscle tone.  Skin: Skin is warm and dry. Capillary refill takes less than 2 seconds.  Psychiatric: She has a normal mood and affect. Her behavior is normal. Judgment and thought content normal.  Tearful during exam, states secondary to pain     ED Treatments / Results  Labs (all labs ordered are listed, but only abnormal results are displayed) Labs Reviewed - No data to display  EKG  EKG Interpretation None       Radiology No results found.  Procedures Procedures (including critical care time)  Medications Ordered in ED Medications  acetaminophen (TYLENOL) tablet 650 mg (650 mg Oral Given 08/06/16 1840)     Initial Impression / Assessment and Plan / ED Course  I have reviewed the triage vital signs and the nursing notes.  Pertinent labs & imaging results that were available during my care of the patient were reviewed by me and considered in my medical decision making (see chart for details).  Clinical Course    Patient is a 30 year old female who presented with bilateral ear pain and some eye redness. Vital signs within normal limits except  for mild hypertension to 144/93. Patient afebrile. Physical exam remarkable for erythema and exudates seen in the external ear canals bilaterally. No signs of mastoiditis at this time. Exam and symptoms most consistent with otitis externa. Unclear why patient has otitis externa as she has no clear risk factors. Tylenol given for pain. Patient discharged with prescription for otic Cortisporin drops. Additionally patient had some scleral redness bilaterally, unclear if this is pathological or secondary to patient being tearful due to pain. Return precautions given.  Final Clinical Impressions(s) / ED Diagnoses   Final diagnoses:  Acute otitis externa of both ears, unspecified type    New Prescriptions Discharge Medication List as of 08/06/2016  6:54 PM    START taking these medications   Details  neomycin-polymyxin-hydrocortisone (CORTISPORIN) otic solution Place 4 drops into both ears 4 (four) times daily., Starting Wed 08/06/2016, Print         Beaulah Dinninghristina M Gambino, MD 08/06/16 2122    Riley Lamouglas  [place], MD 08/06/16 2149

## 2017-01-01 ENCOUNTER — Emergency Department (HOSPITAL_BASED_OUTPATIENT_CLINIC_OR_DEPARTMENT_OTHER): Payer: Self-pay

## 2017-01-01 ENCOUNTER — Encounter (HOSPITAL_BASED_OUTPATIENT_CLINIC_OR_DEPARTMENT_OTHER): Payer: Self-pay | Admitting: *Deleted

## 2017-01-01 ENCOUNTER — Emergency Department (HOSPITAL_BASED_OUTPATIENT_CLINIC_OR_DEPARTMENT_OTHER)
Admission: EM | Admit: 2017-01-01 | Discharge: 2017-01-01 | Disposition: A | Discharge status: Home / Self Care | Payer: Self-pay | Attending: Emergency Medicine | Admitting: Emergency Medicine

## 2017-01-01 DIAGNOSIS — R0789 Other chest pain: Secondary | ICD-10-CM

## 2017-01-01 DIAGNOSIS — R059 Cough, unspecified: Secondary | ICD-10-CM

## 2017-01-01 DIAGNOSIS — F17200 Nicotine dependence, unspecified, uncomplicated: Secondary | ICD-10-CM | POA: Insufficient documentation

## 2017-01-01 DIAGNOSIS — R05 Cough: Secondary | ICD-10-CM | POA: Insufficient documentation

## 2017-01-01 LAB — CBC
HEMATOCRIT: 34.8 % — AB (ref 36.0–46.0)
Hemoglobin: 11.9 g/dL — ABNORMAL LOW (ref 12.0–15.0)
MCH: 29.8 pg (ref 26.0–34.0)
MCHC: 34.2 g/dL (ref 30.0–36.0)
MCV: 87 fL (ref 78.0–100.0)
Platelets: 328 10*3/uL (ref 150–400)
RBC: 4 MIL/uL (ref 3.87–5.11)
RDW: 12 % (ref 11.5–15.5)
WBC: 9.4 10*3/uL (ref 4.0–10.5)

## 2017-01-01 LAB — BASIC METABOLIC PANEL
Anion gap: 9 (ref 5–15)
BUN: 6 mg/dL (ref 6–20)
CHLORIDE: 101 mmol/L (ref 101–111)
CO2: 28 mmol/L (ref 22–32)
Calcium: 9.2 mg/dL (ref 8.9–10.3)
Creatinine, Ser: 0.64 mg/dL (ref 0.44–1.00)
GFR calc Af Amer: >60 mL/min (ref >60)
GFR calc non Af Amer: >60 mL/min (ref >60)
Glucose, Bld: 120 mg/dL — ABNORMAL HIGH (ref 65–99)
Potassium: 4.4 mmol/L (ref 3.5–5.1)
SODIUM: 138 mmol/L (ref 135–145)

## 2017-01-01 LAB — TROPONIN I

## 2017-01-01 MED ORDER — HYDROCODONE-HOMATROPINE 5-1.5 MG/5ML PO SYRP: 5.0000 mL | ORAL_SOLUTION | Freq: Four times a day (QID) | ORAL | 0 refills | Status: AC | PRN

## 2017-01-01 MED ORDER — IBUPROFEN 400 MG PO TABS
600.0000 mg | ORAL_TABLET | Freq: Once | ORAL | Status: AC
  Administered 2017-01-01: 17:00:00 600 mg via ORAL
  Filled 2017-01-01: qty 1

## 2017-01-01 MED ORDER — GUAIFENESIN 100 MG/5ML PO LIQD: 100.0000 mg | ORAL | 0 refills | Status: AC | PRN

## 2017-01-01 MED ORDER — ACETAMINOPHEN 500 MG PO TABS
1000.0000 mg | ORAL_TABLET | Freq: Once | ORAL | Status: AC
  Administered 2017-01-01: 1000 mg via ORAL
  Filled 2017-01-01: qty 2

## 2017-01-01 MED FILL — HYDROCODONE-HOMATROPINE SYR: 5-1.5 | 6 days supply | Qty: 120 | Fill #0

## 2017-01-01 NOTE — ED Triage Notes (Signed)
Pain in her left chest x 2 days. Worse with movement. Sharp.

## 2017-01-01 NOTE — Discharge Instructions (Signed)
Your EKG, CXR and blood work today was reassuring. I suspect that your chest wall pain is secondary to your recent, persistent cough.  It is important that we control your cough and chest wall pain so you are able to take deep breaths and inflate your lungs. Take 600 mg + 1000 tylenol for pain every 8 hours for pain for the next 3-5 days.  Use your incentive inspirometer device as often as possible to help facilitate lung inflation, this will decrease risk of pneumonia. Take hycodan syrup for disruptive night time cough, this medication will also help with chest wall pain.  Take Robitussin for chest congestion and phlegm, during the day.   Return to the ED if your chest wall pain becomes exertional or is associated with nausea, vomiting, sweating, palpitations.  Return to ED if you develop fever, continued productive cough with phlegm, night chills, sweats or shortness of breath.

## 2017-01-01 NOTE — ED Provider Notes (Signed)
[facility]   CSN: [id number] Arrival date & time: 01/01/17  1246     History   Chief Complaint Chief Complaint  Patient presents with  . Chest Pain    HPI Natalie [name] is a 31 y.o. female with pertinent pmh of heart murmur, tobacco use and seizures presents to ED with sudden onset, constant, non radiating, sharp left sided chest wall pain x 2 days.  Aggravating factors include palpation, coughing, taking deep breaths.  Alleviating factors include taking shallow breaths.  Patient has not tried anything to alleviate symptoms.  Patient reports having a cold three weeks ago, since she reports a "lingering" cough that is occasionally productive and very disruptive.  Cough has been improving over the last three weeks.  Patient denies exertional CP or SOB, nausea, vomiting, diaphoresis, palpitations.  No h/o IVDU.  No fevers, hemoptysis, h/o DVT/PE, use of oral contraceptives.    HPI  Past Medical History:  Diagnosis Date  . Broken back    during seizure  . Heart murmur   . Panic attacks   . Seizures (HCC)     There are no active problems to display for this patient.   Past Surgical History:  Procedure Laterality Date  . TONSILLECTOMY      OB History    Gravida Para Term Preterm AB Living   1             SAB TAB Ectopic Multiple Live Births                   Home Medications    Prior to Admission medications   Medication Sig Start Date End Date Taking? Authorizing Provider  diphenhydrAMINE (BENADRYL) 25 mg capsule Take 1 capsule (25 mg total) by mouth every 6 (six) hours as needed (eustachian tube dysfunction). 04/23/16   Trixie Dredge, PA-C  fluticasone (FLONASE) 50 MCG/ACT nasal spray Place 2 sprays into both nostrils daily. 04/23/16   Trixie Dredge, PA-C  guaiFENesin (ROBITUSSIN) 100 MG/5ML liquid Take 5-10 mLs (100-200 mg total) by mouth every 4 (four) hours as needed for cough. For phlegm and chest congestion 01/01/17   Liberty Handy, PA-C    HYDROcodone-homatropine [facility]) 5-1.5 MG/5ML syrup Take 5 mLs by mouth every 6 (six) hours as needed for cough. FOR DISRUPTIVE NIGHT TIME COUGH AND BODY ACHES OR CHEST WALL PAIN DUE TO COUGH 01/01/17   Liberty Handy, PA-C  ibuprofen (ADVIL,MOTRIN) 800 MG tablet Take 1 tablet (800 mg total) by mouth every 8 (eight) hours as needed. 04/23/16   Trixie Dredge, PA-C  neomycin-polymyxin-hydrocortisone (CORTISPORIN) otic solution Place 4 drops into both ears 4 (four) times daily. 08/06/16   Beaulah Dinning, MD  pseudoephedrine (SUDAFED) 60 MG tablet Take 1 tablet (60 mg total) by mouth every 6 (six) hours as needed for congestion. 04/23/16   Trixie Dredge, PA-C    Family History No family history on file.  Social History Social History  Substance Use Topics  . Smoking status: Current Some Day Smoker  . Smokeless tobacco: Never Used  . Alcohol use Yes     Comment: occasionally     Allergies   Wellbutrin [bupropion hcl]   Review of Systems Review of Systems  Constitutional: Negative for chills and fever.  HENT: Negative for congestion and sore throat.   Respiratory: Positive for cough. Negative for choking, chest tightness and shortness of breath.   Cardiovascular: Positive for chest pain. Negative for palpitations and leg swelling.  Gastrointestinal:  Negative for abdominal pain, constipation, diarrhea, nausea and vomiting.  Genitourinary: Negative for difficulty urinating.  Musculoskeletal: Negative for arthralgias.  Skin: Negative for wound.  Neurological: Negative for seizures, syncope, weakness, light-headedness, numbness and headaches.  Hematological: Does not bruise/bleed easily.  Psychiatric/Behavioral: Negative.      Physical Exam Updated Vital Signs BP 106/69 (BP Location: Right Arm)   Pulse 74   Temp 98.3 F (36.8 C) (Oral)   Resp 14   Ht 5' (1.524 m)   Wt 52.2 kg   LMP 11/03/2016   SpO2 100%   BMI 22.46 kg/m   Physical Exam  Constitutional: She is oriented to  person, place, and time. She appears well-developed and well-nourished.  HENT:  Head: Normocephalic and atraumatic.  Nose: Nose normal.  No posterior oropharyngeal erythema, edema or exudates. Moist mucous membranes  Eyes: Conjunctivae and EOM are normal. Pupils are equal, round, and reactive to light.  Neck: Normal range of motion. Neck supple. No JVD present.  Cardiovascular: Normal rate, regular rhythm, normal heart sounds and intact distal pulses.   No murmur heard. No tachycardia. No tachypnea. Carotid pulses 2+ bilaterally without bruits. Good S1 and S2. No extra sounds or murmurs. 2+ and symmetric radial and dorsalis pedis pulses bilaterally.   Capillary refill brisk in upper extremities.  No varicosities seen. No lower extremity edema. No calf tenderness. Good lung sounds without crackles.  Pulmonary/Chest: Effort normal and breath sounds normal. No respiratory distress. She has no wheezes. She has no rales. She exhibits tenderness.  +Left lateral chest wall tenderness RR within normal limits. SpO2 within normal limits.  Normal breathing effort. Patient speaking in full sentences. No chest wall retractions. No cyanosis. Chest wall expansion symmetric.   Lungs CTAB anteriorly and posteriorly without wheezing, rhonchi or crackles.  No egophony.   Abdominal: Soft. Bowel sounds are normal. She exhibits no distension and no mass. There is no tenderness. There is no rebound and no guarding.  Musculoskeletal: Normal range of motion. She exhibits no deformity.  Lymphadenopathy:    She has no cervical adenopathy.  Neurological: She is alert and oriented to person, place, and time. No sensory deficit.  Skin: Skin is warm and dry. Capillary refill takes less than 2 seconds.  Psychiatric: She has a normal mood and affect. Her behavior is normal. Judgment and thought content normal.  Nursing note and vitals reviewed.    ED Treatments / Results  Labs (all labs ordered are listed, but  only abnormal results are displayed) Labs Reviewed  BASIC METABOLIC PANEL - Abnormal; Notable for the following:       Result Value   Glucose, Bld 120 (*)    All other components within normal limits  CBC - Abnormal; Notable for the following:    Hemoglobin 11.9 (*)    HCT 34.8 (*)    All other components within normal limits  TROPONIN I    EKG  EKG Interpretation None       Radiology Dg Chest 2 View  Result Date: 01/01/2017 CLINICAL DATA:  Sharp left chest pain EXAM: CHEST  2 VIEW COMPARISON:  09/16/2011 FINDINGS: Streaky density along a major fissure and overlapping the spine in the lateral projection, likely on the left. No pleural based wedge shaped opacity. No edema, effusion, or pneumothorax. Scoliosis and cervical rib. Normal heart size. IMPRESSION: Mild atelectasis at the bases. Electronically Signed   By: Marnee Spring M.D.   On: 01/01/2017 13:24    Procedures Procedures (including critical care time)  Medications Ordered in ED Medications  acetaminophen (TYLENOL) tablet 1,000 mg (1,000 mg Oral Given 01/01/17 1631)  ibuprofen (ADVIL,MOTRIN) tablet 600 mg (600 mg Oral Given 01/01/17 1631)     Initial Impression / Assessment and Plan / ED Course  I have reviewed the triage vital signs and the nursing notes.  Pertinent labs & imaging results that were available during my care of the patient were reviewed by me and considered in my medical decision making (see chart for details).    Pt is a 31 y.o. female presents with left lateral chest wall pain, positional and reproducible with palpation, cough, and UE range of motion. Pertinent risk factors for ACS include tobacco use.  On exam VS are wnl. RRR, symmetric pulses bilaterally.  Lungs CTAB.  Patient is non toxic appearing.  CXR, EKG, troponin x 1 within normal limits.  CBC and BMP unremarkable.  Heart score = 1.  PERC negative.  Pt was given tylenol and ibuprofen in ED. Patient is to be discharged with recommendation to  follow up with PCP in regards to today's ED visit. It is possible that chest pain could be due to cardiac etiology however given presentation, PERC negative, reassuring physical exam and low risk HEART score, patient is low risk and is safe for discharge for outpatient follow up.  Suspect muscular etiology or costochondritis given recent URI and post viral cough that is still persistent and disruptive.  Advised NSAIDs, cough suppressant and expectorant for symptoms. ED return precautions given. Pt appears reliable for follow up and is agreeable to discharge. All questions and concerns addressed prior to discharge.   Final Clinical Impressions(s) / ED Diagnoses   Final diagnoses:  Chest wall pain  Cough    New Prescriptions Discharge Medication List as of 01/01/2017  4:41 PM    START taking these medications   Details  guaiFENesin (ROBITUSSIN) 100 MG/5ML liquid Take 5-10 mLs (100-200 mg total) by mouth every 4 (four) hours as needed for cough. For phlegm and chest congestion, Starting Thu 01/01/2017, Print    HYDROcodone-homatropine (HYCODAN) 5-1.5 MG/5ML syrup Take 5 mLs by mouth every 6 (six) hours as needed for cough. FOR DISRUPTIVE NIGHT TIME COUGH AND BODY ACHES OR CHEST WALL PAIN DUE TO COUGH, Starting Thu 01/01/2017, Print         Liberty Handy, PA-C 01/01/17 2045    Loren Racer, MD 01/01/17 2302
